# Patient Record
Sex: Male | Born: 1965 | Race: White | Hispanic: No | Marital: Married | Smoking: Never smoker
Health system: Southern US, Community
[De-identification: ages and names within clinical notes are randomized; demographics above are authoritative.]

## PROBLEM LIST (undated history)

## (undated) DIAGNOSIS — E663 Overweight: Secondary | ICD-10-CM

## (undated) DIAGNOSIS — K635 Polyp of colon: Secondary | ICD-10-CM

## (undated) DIAGNOSIS — Z789 Other specified health status: Secondary | ICD-10-CM

## (undated) DIAGNOSIS — M109 Gout, unspecified: Secondary | ICD-10-CM

## (undated) DIAGNOSIS — Z800 Family history of malignant neoplasm of digestive organs: Secondary | ICD-10-CM

## (undated) DIAGNOSIS — M1990 Unspecified osteoarthritis, unspecified site: Secondary | ICD-10-CM

## (undated) DIAGNOSIS — M722 Plantar fascial fibromatosis: Secondary | ICD-10-CM

## (undated) DIAGNOSIS — E785 Hyperlipidemia, unspecified: Secondary | ICD-10-CM

## (undated) DIAGNOSIS — J45909 Unspecified asthma, uncomplicated: Secondary | ICD-10-CM

## (undated) DIAGNOSIS — R17 Unspecified jaundice: Secondary | ICD-10-CM

## (undated) DIAGNOSIS — G8929 Other chronic pain: Secondary | ICD-10-CM

## (undated) HISTORY — DX: Other chronic pain: G89.29

## (undated) HISTORY — DX: Hyperlipidemia, unspecified: E78.5

## (undated) HISTORY — PX: APPENDECTOMY: SHX54

## (undated) HISTORY — DX: Gout, unspecified: M10.9

## (undated) HISTORY — DX: Plantar fascial fibromatosis: M72.2

## (undated) HISTORY — DX: Overweight: E66.3

## (undated) HISTORY — PX: VASECTOMY: SHX75

## (undated) HISTORY — PX: KNEE SURGERY: SHX244

## (undated) HISTORY — DX: Family history of malignant neoplasm of digestive organs: Z80.0

## (undated) HISTORY — PX: TONSILLECTOMY AND ADENOIDECTOMY: SUR1326

## (undated) HISTORY — DX: Unspecified jaundice: R17

---
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
## 2006-05-10 ENCOUNTER — Ambulatory Visit: Payer: Self-pay | Admitting: Internal Medicine

## 2009-12-10 ENCOUNTER — Ambulatory Visit: Payer: Self-pay

## 2009-12-19 ENCOUNTER — Ambulatory Visit: Payer: Self-pay

## 2010-02-07 ENCOUNTER — Ambulatory Visit: Payer: Self-pay | Admitting: General Practice

## 2010-02-14 ENCOUNTER — Ambulatory Visit: Payer: Self-pay

## 2012-03-31 IMAGING — US US EXTREM LOW VENOUS*R*
1 series · 17 of 23 positions shown · non-contrast
Comparison: none

REASON FOR EXAM: rt lower leg pain  swelling  eval DVT
COMMENTS:

[Series 1: us extrem low venous*right* · 17 of 23 slices shown]
[im 1/23]
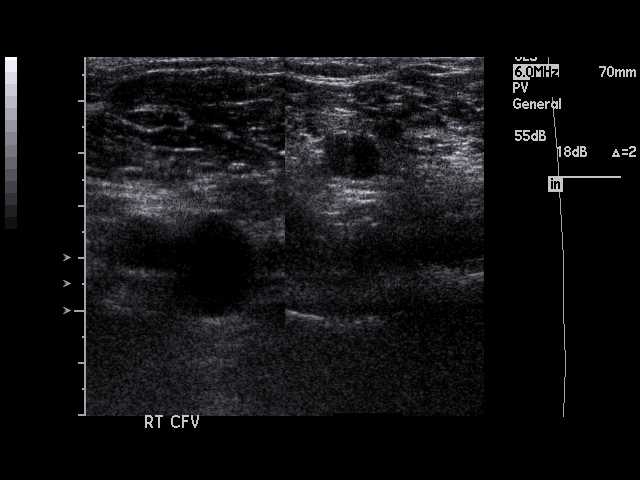
[im 3/23]
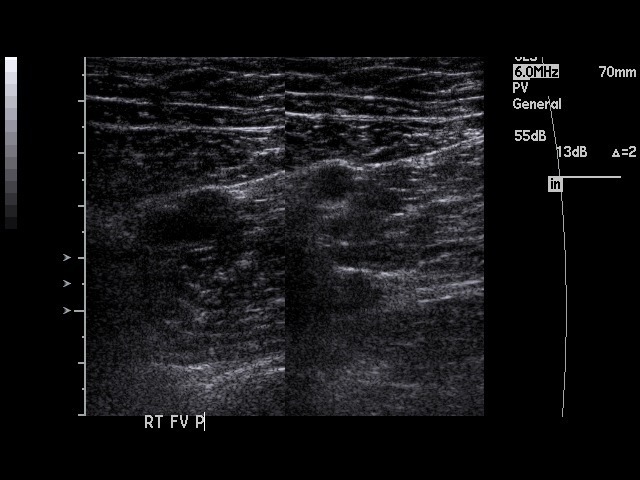
[im 4/23]
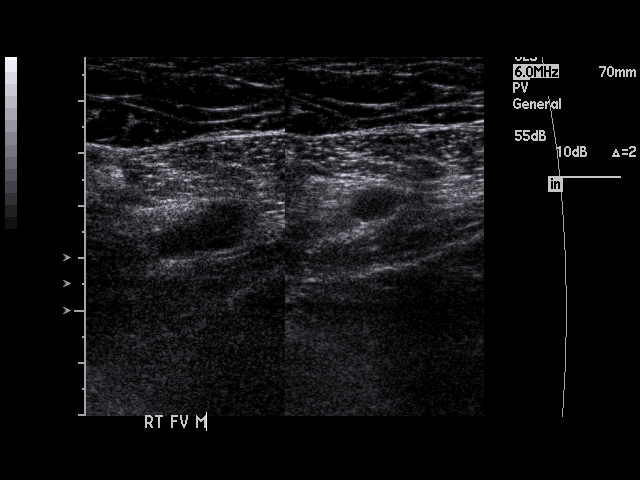
[im 5/23]
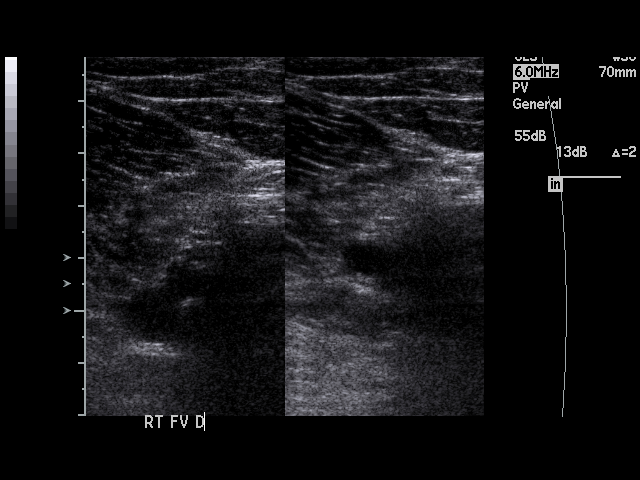
[im 7/23]
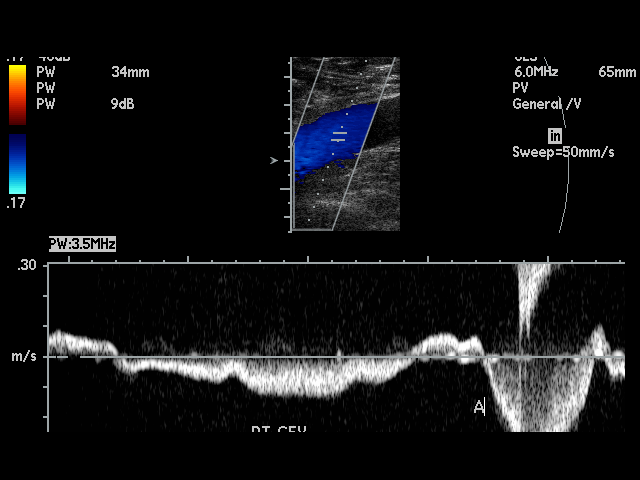
[im 8/23]
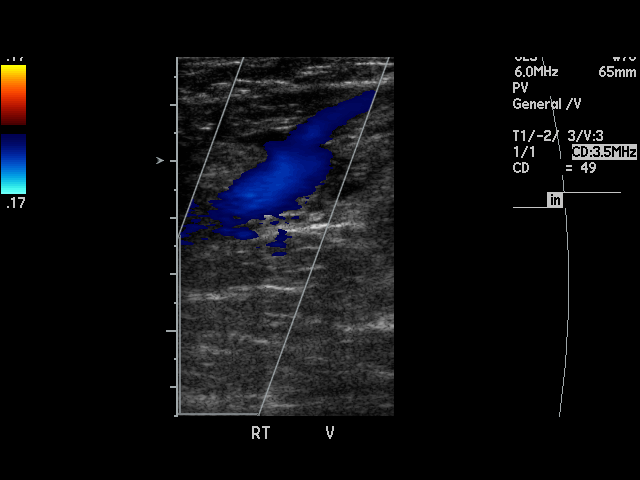
[im 9/23]
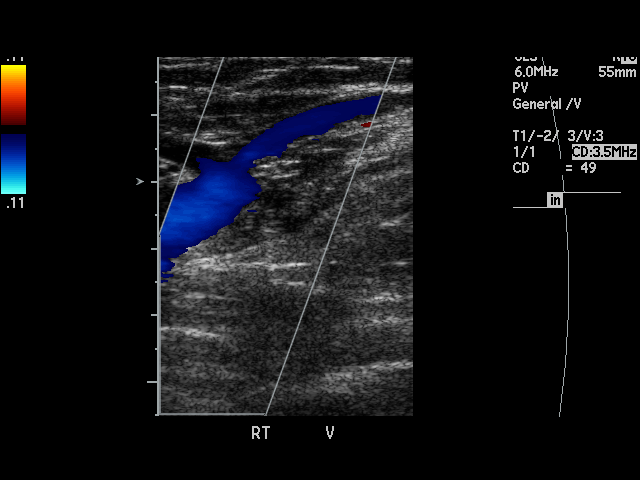
[im 11/23]
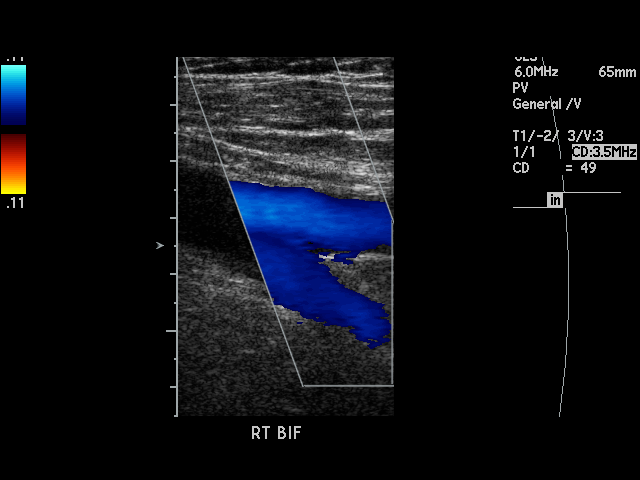
[im 12/23]
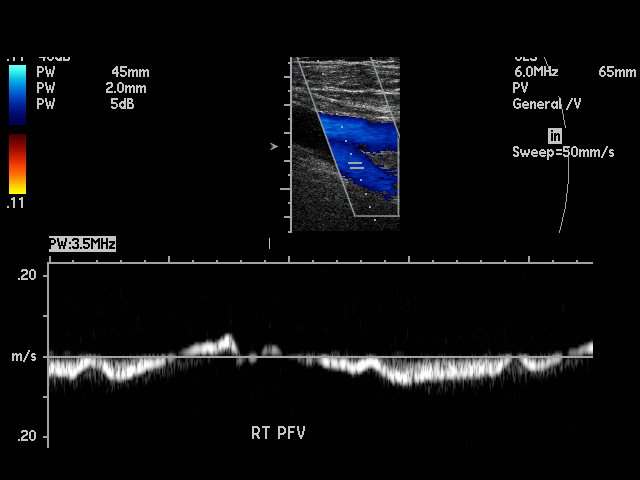
[im 13/23]
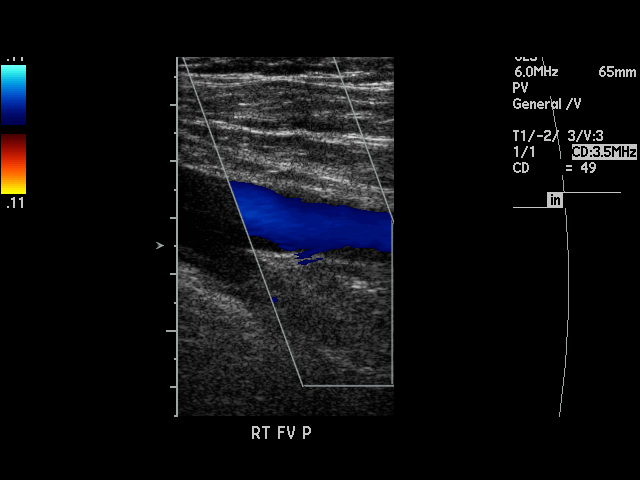
[im 15/23]
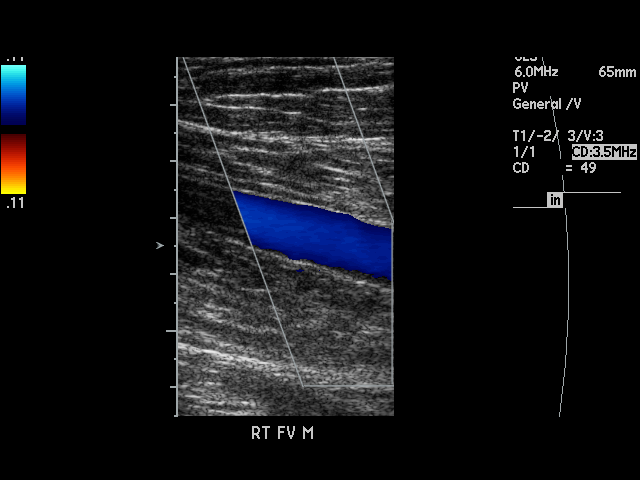
[im 16/23]
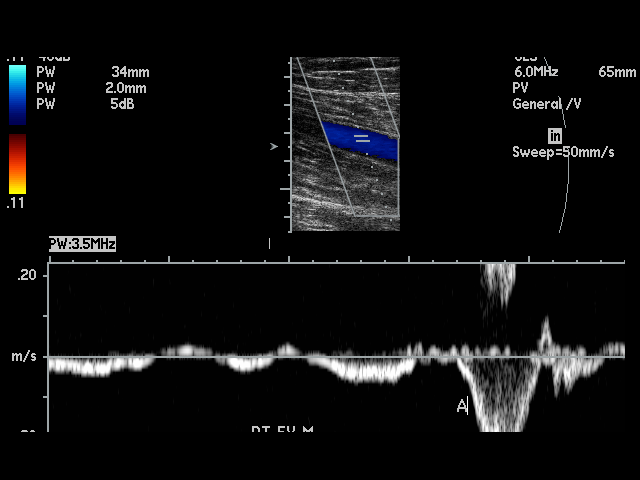
[im 17/23]
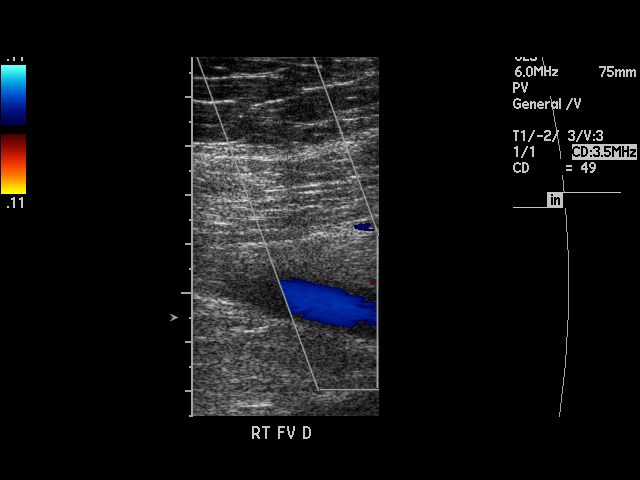
[im 19/23]
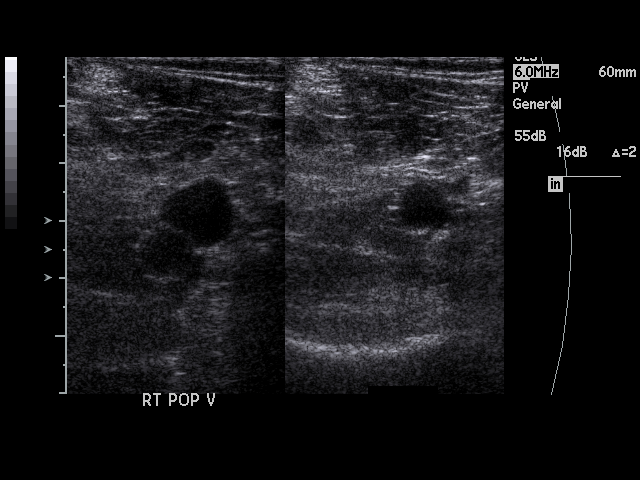
[im 20/23]
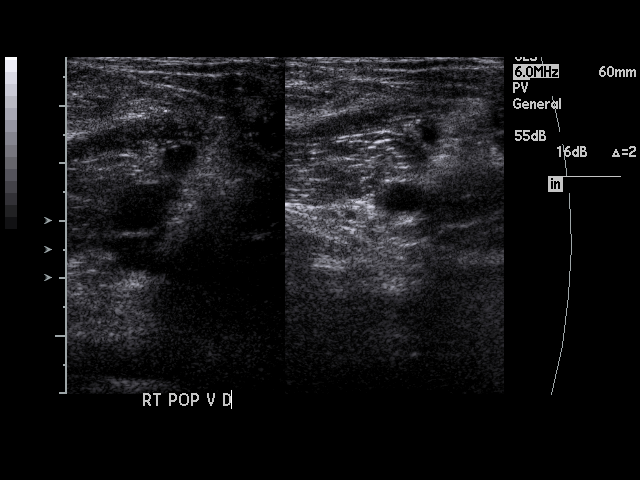
[im 21/23]
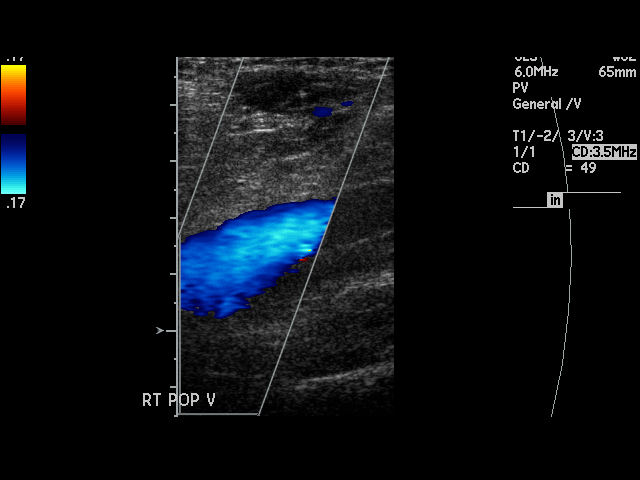
[im 23/23]
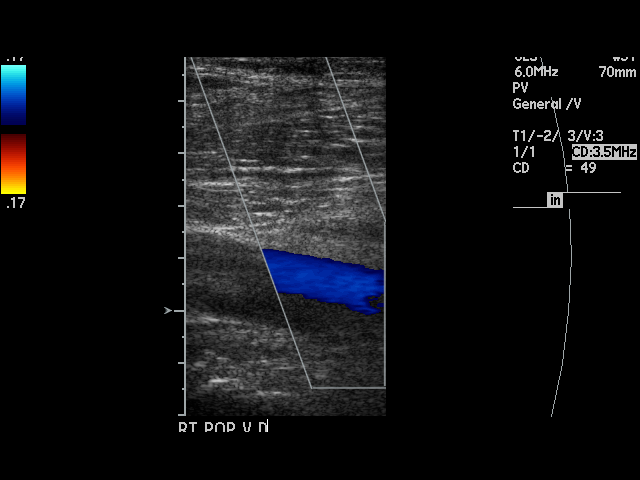

[17 of 23 positions shown; findings below may reference images not displayed]

PROCEDURE:     US  - US DOPPLER LOW EXTR RIGHT  - February 14, 2010  [DATE]

RESULT:     The augmentation and phasic flow waveforms are normal in
appearance. The femoral and popliteal vein shows complete compressibility
throughout its course. Doppler examination shows no occlusion or evidence of
deep vein thrombosis.
IMPRESSION: No deep vein thrombosis is identified in the right leg.

## 2014-06-16 DIAGNOSIS — K635 Polyp of colon: Secondary | ICD-10-CM

## 2014-06-16 HISTORY — DX: Polyp of colon: K63.5

## 2014-06-27 LAB — HM COLONOSCOPY

## 2014-07-16 ENCOUNTER — Ambulatory Visit: Payer: BC Managed Care – PPO | Admitting: Podiatry

## 2014-07-20 ENCOUNTER — Encounter: Payer: Self-pay | Admitting: Podiatry

## 2014-07-20 ENCOUNTER — Ambulatory Visit (INDEPENDENT_AMBULATORY_CARE_PROVIDER_SITE_OTHER): Payer: BC Managed Care – PPO | Admitting: Podiatry

## 2014-07-20 ENCOUNTER — Ambulatory Visit (INDEPENDENT_AMBULATORY_CARE_PROVIDER_SITE_OTHER): Payer: BC Managed Care – PPO

## 2014-07-20 ENCOUNTER — Other Ambulatory Visit: Payer: Self-pay | Admitting: *Deleted

## 2014-07-20 VITALS — BP 122/72 | HR 66 | Resp 16 | Ht 72.0 in | Wt 240.0 lb

## 2014-07-20 DIAGNOSIS — M109 Gout, unspecified: Secondary | ICD-10-CM | POA: Insufficient documentation

## 2014-07-20 DIAGNOSIS — E669 Obesity, unspecified: Secondary | ICD-10-CM

## 2014-07-20 DIAGNOSIS — M778 Other enthesopathies, not elsewhere classified: Secondary | ICD-10-CM

## 2014-07-20 DIAGNOSIS — M7750 Other enthesopathy of unspecified foot: Secondary | ICD-10-CM

## 2014-07-20 DIAGNOSIS — M779 Enthesopathy, unspecified: Principal | ICD-10-CM

## 2014-07-20 DIAGNOSIS — E6609 Other obesity due to excess calories: Secondary | ICD-10-CM | POA: Insufficient documentation

## 2014-07-20 MED ORDER — TRIAMCINOLONE ACETONIDE 10 MG/ML IJ SUSP
10.0000 mg | Freq: Once | INTRAMUSCULAR | Status: AC
Start: 2014-07-20 — End: 2014-07-31
  Administered 2014-07-31: 10 mg

## 2014-07-20 NOTE — Progress Notes (Signed)
   Subjective:    Patient ID: Bill Campbell, male    DOB: May 30, 1966, 48 y.o.   MRN: [id number]  HPI Comments: I had a gout flare up and it eased off and than the ball of foot feels like a stone bruise , its between the big toe and the second toe right foot. Burning feeling . It started Wednesday of last week. Fungus on the left great toenail.   Foot Pain      Review of Systems  All other systems reviewed and are negative.      Objective:   Physical Exam        Assessment & Plan:

## 2014-07-20 NOTE — Progress Notes (Signed)
Subjective:     Patient ID: Bill Campbell, male   DOB: [birth year], 48 y.o.   MRN: [id number] I'm having a lot of pain in my right foot second metatarsal for the last week and I had a gout attack a few weeks ago Foot Pain     Review of Systems  All other systems reviewed and are negative.      Objective:   Physical Exam  Nursing note and vitals reviewed. Constitutional: He is oriented to person, place, and time.  Cardiovascular: Intact distal pulses.   Musculoskeletal: Normal range of motion.  Neurological: He is oriented to person, place, and time.  Skin: Skin is warm.   neurovascular status intact with muscle strength adequate and range of motion of the subtalar and midtarsal joint within normal limits. Patient's found to have significant discomfort with fluid buildup in the second metatarsophalangeal joint right that is very painful when pressed and is noted to have good digital perfusion and is well oriented x3     Assessment:     Inflamed capsulitis second MPJ right with fluid buildup and area where there's been a structural bunion and history of gout right first MPJ    Plan:     H&P and x-rays reviewed. Did a proximal nerve block aspirated the right second MPJ and injected with half a cc of dexamethasone Kenalog and applied thick pad to reduce pressure on the joint teary at I did discuss that ultimately this may require surgery as it does appear there's been some damage to the flexor plate but we will make that determination as we gauged the response to medication he is on and we'll also consider long-term orthotic therapy

## 2014-07-20 NOTE — Patient Instructions (Signed)

## 2014-07-31 ENCOUNTER — Ambulatory Visit: Payer: BC Managed Care – PPO | Admitting: Podiatry

## 2014-07-31 VITALS — BP 123/70 | HR 63 | Resp 16

## 2014-07-31 DIAGNOSIS — M79609 Pain in unspecified limb: Secondary | ICD-10-CM

## 2014-07-31 DIAGNOSIS — M7750 Other enthesopathy of unspecified foot: Secondary | ICD-10-CM

## 2014-07-31 DIAGNOSIS — M779 Enthesopathy, unspecified: Principal | ICD-10-CM

## 2014-07-31 DIAGNOSIS — M778 Other enthesopathies, not elsewhere classified: Secondary | ICD-10-CM

## 2014-07-31 DIAGNOSIS — M109 Gout, unspecified: Secondary | ICD-10-CM

## 2014-07-31 MED ORDER — TRIAMCINOLONE ACETONIDE 10 MG/ML IJ SUSP
10.0000 mg | Freq: Once | INTRAMUSCULAR | Status: AC
  Administered 2014-07-31: 10 mg

## 2014-07-31 NOTE — Progress Notes (Signed)
Subjective:     Patient ID: Bill Campbell, male   DOB: October 05, 1966, 48 y.o.   MRN: [id number]  HPI patient states that it's better than it was but he did have several bouts of pain when he spent extensive amount of time on his foot   Review of Systems     Objective:   Physical Exam Neurovascular status intact with muscle strength adequate and patient noted to have continued discomfort second metatarsophalangeal joint right with fluid buildup around the joint surface    Assessment:     Continued inflammatory capsulitis right which has improved but is still present    Plan:     Dispensed graphite insole to reduce stress along with pads and scanned for custom orthotics to reduce stress against the joint surface. Reappoint when orthotics returned

## 2014-08-24 ENCOUNTER — Ambulatory Visit: Payer: BC Managed Care – PPO | Admitting: Podiatry

## 2014-08-31 ENCOUNTER — Encounter: Payer: Self-pay | Admitting: Podiatry

## 2014-08-31 ENCOUNTER — Ambulatory Visit (INDEPENDENT_AMBULATORY_CARE_PROVIDER_SITE_OTHER): Payer: BC Managed Care – PPO | Admitting: Podiatry

## 2014-08-31 VITALS — BP 126/77 | HR 64 | Resp 16

## 2014-08-31 DIAGNOSIS — M778 Other enthesopathies, not elsewhere classified: Secondary | ICD-10-CM

## 2014-08-31 DIAGNOSIS — M7751 Other enthesopathy of right foot: Secondary | ICD-10-CM

## 2014-08-31 DIAGNOSIS — M779 Enthesopathy, unspecified: Principal | ICD-10-CM

## 2014-08-31 NOTE — Patient Instructions (Signed)

## 2014-08-31 NOTE — Progress Notes (Signed)
Subjective:     Patient ID: Bill Campbell, male   DOB: 10-Jul-1966, 48 y.o.   MRN: [id number]  HPI patient presents stating his foot is feeling quite a bit better with occasional soreness if he does a lot of walking   Review of Systems     Objective:   Physical Exam Neurovascular status intact with inflammation around the second MPJ right which is improving over time and especially with graphite insole    Assessment:     Reviewed condition and discussed orthotic treatment    Plan:     Dispensed orthotics with instructions on usage and reappoint her recheck

## 2015-11-14 ENCOUNTER — Other Ambulatory Visit: Payer: Self-pay | Admitting: Family Medicine

## 2015-11-14 ENCOUNTER — Ambulatory Visit
Admission: RE | Admit: 2015-11-14 | Discharge: 2015-11-14 | Disposition: A | Discharge status: Home / Self Care | Payer: BLUE CROSS/BLUE SHIELD | Source: Ambulatory Visit | Attending: Family Medicine | Admitting: Family Medicine

## 2015-11-14 DIAGNOSIS — M5430 Sciatica, unspecified side: Secondary | ICD-10-CM | POA: Diagnosis not present

## 2015-11-14 DIAGNOSIS — M25559 Pain in unspecified hip: Secondary | ICD-10-CM | POA: Insufficient documentation

## 2015-11-14 DIAGNOSIS — M5137 Other intervertebral disc degeneration, lumbosacral region: Secondary | ICD-10-CM | POA: Diagnosis not present

## 2015-11-14 DIAGNOSIS — R52 Pain, unspecified: Secondary | ICD-10-CM

## 2017-01-19 LAB — PSA: PSA: 0.9

## 2017-01-19 LAB — LIPID PANEL
Cholesterol: 189 (ref 0–200)
HDL: 32 — AB (ref 35–70)
LDL Cholesterol: 125
Triglycerides: 159 (ref 40–160)

## 2017-12-29 IMAGING — CR DG HIP (WITH OR WITHOUT PELVIS) 2V BILAT
1 series · 5 of 5 positions shown · non-contrast
Comparison: None.

CLINICAL DATA: One year history of back and bilateral hip pain due
to a volleyball injury.

EXAM:
LUMBAR SPINE - COMPLETE 4+ VIEW; DG HIP (WITH OR WITHOUT PELVIS) 2V
BILAT

[Series 1: view not recorded · 0.14mm/px · 5 of 5 slices shown]
[im 1/5]
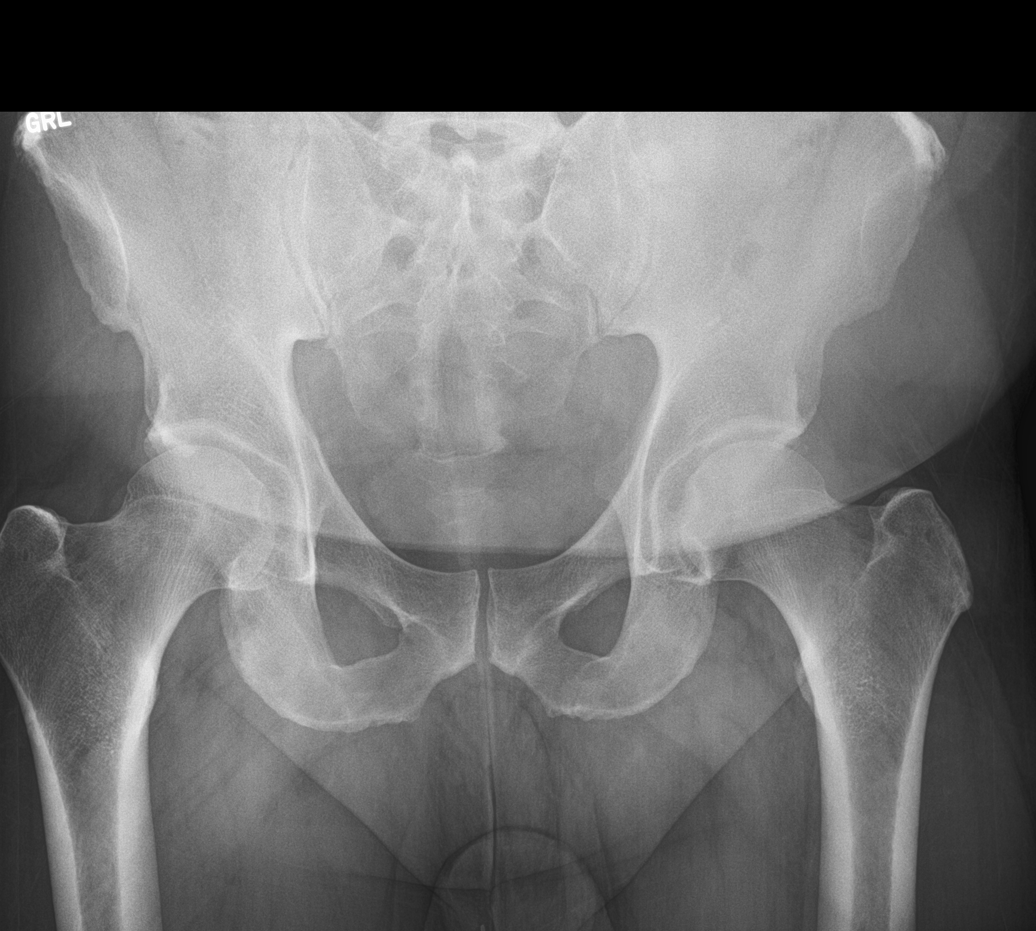
[im 2/5]
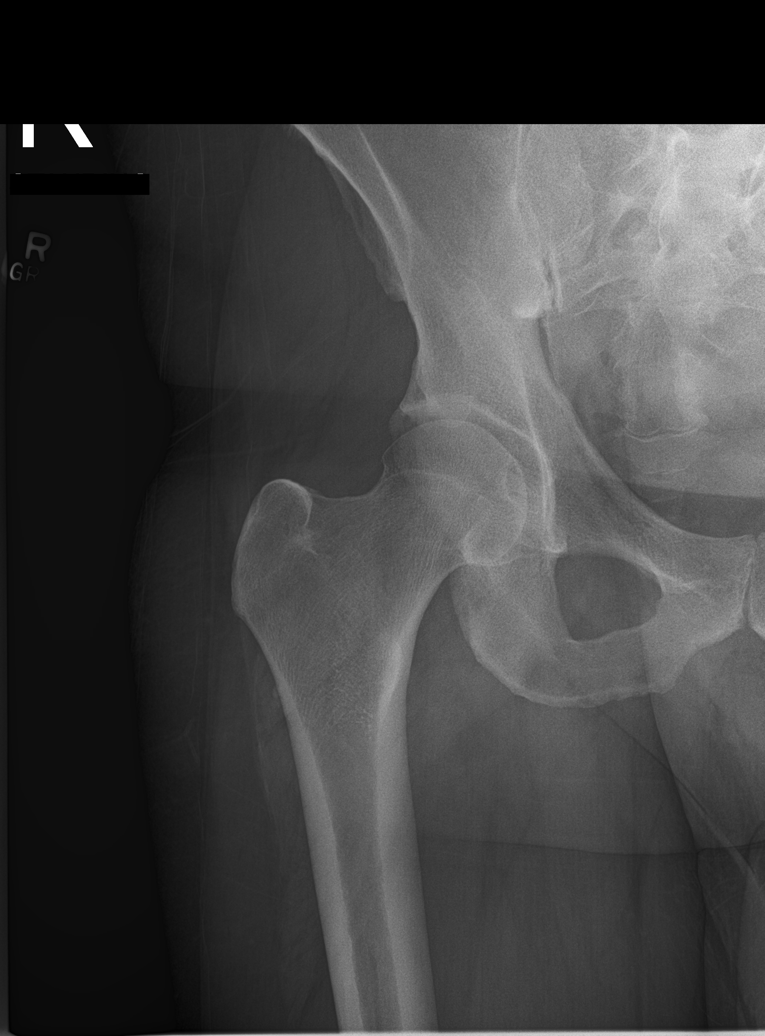
[im 3/5]
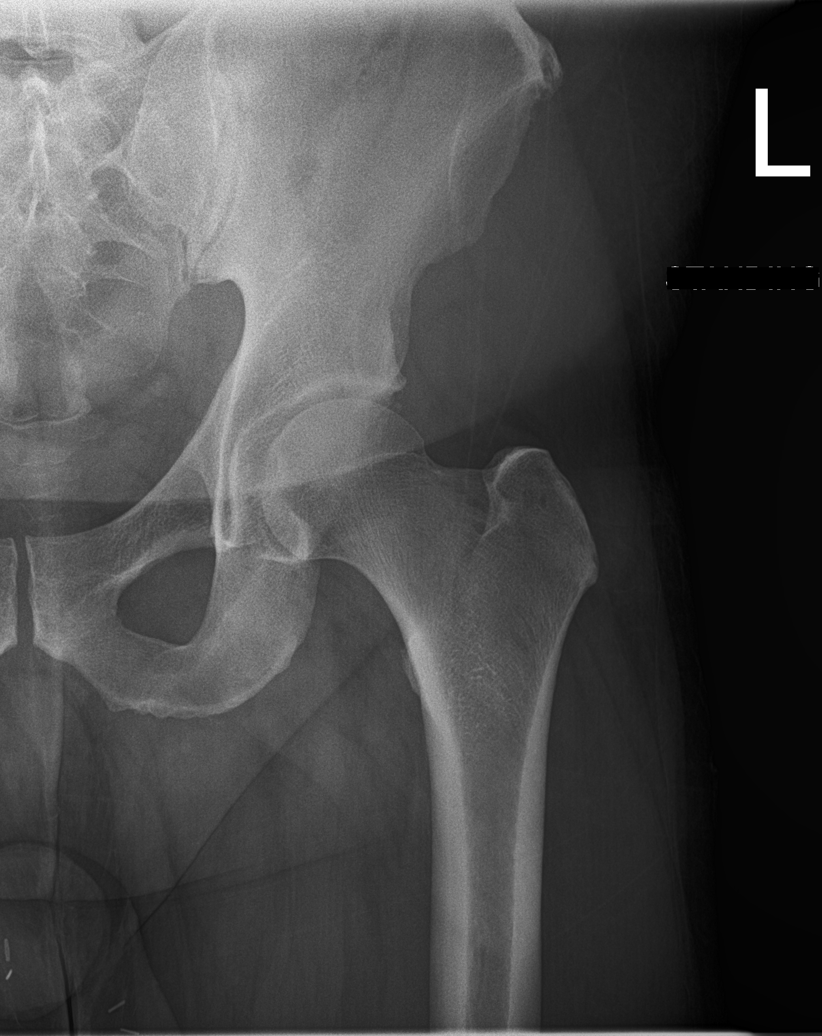
[im 4/5]
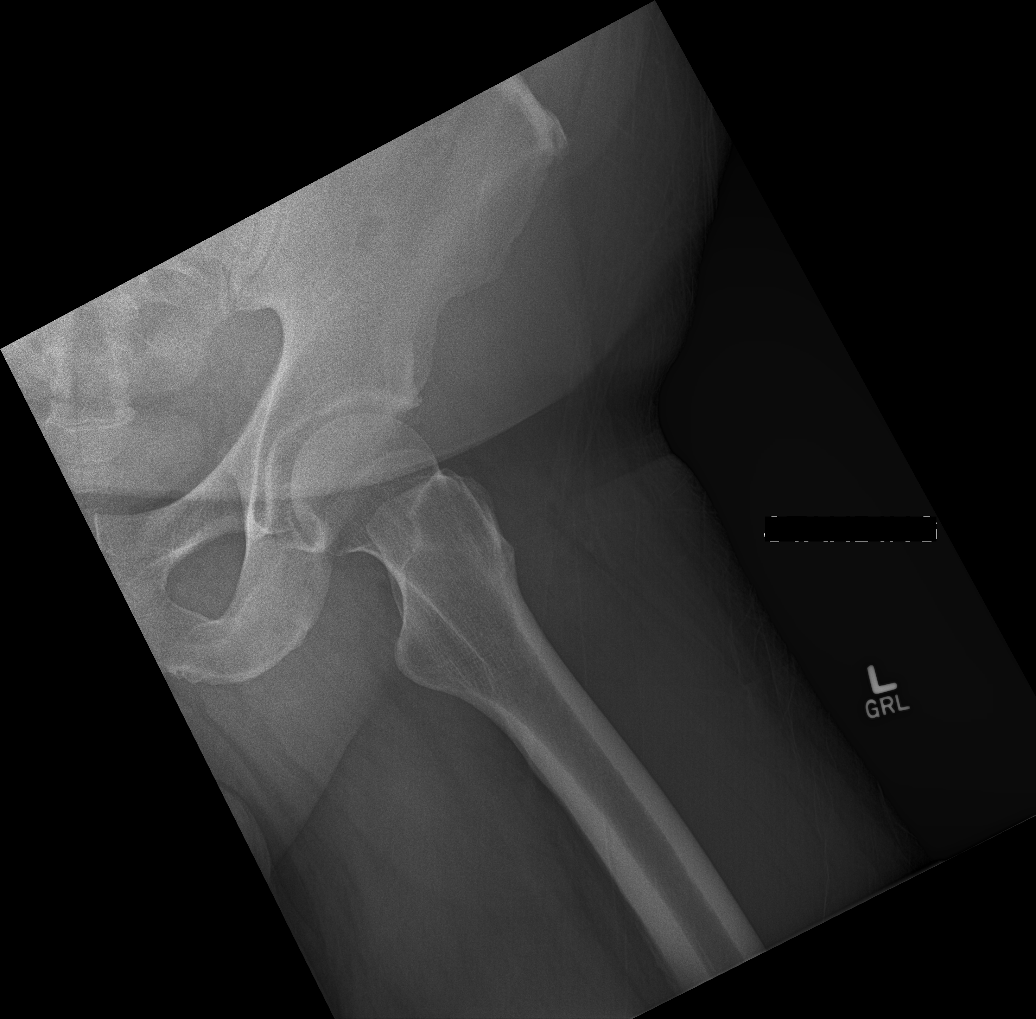
[im 5/5]
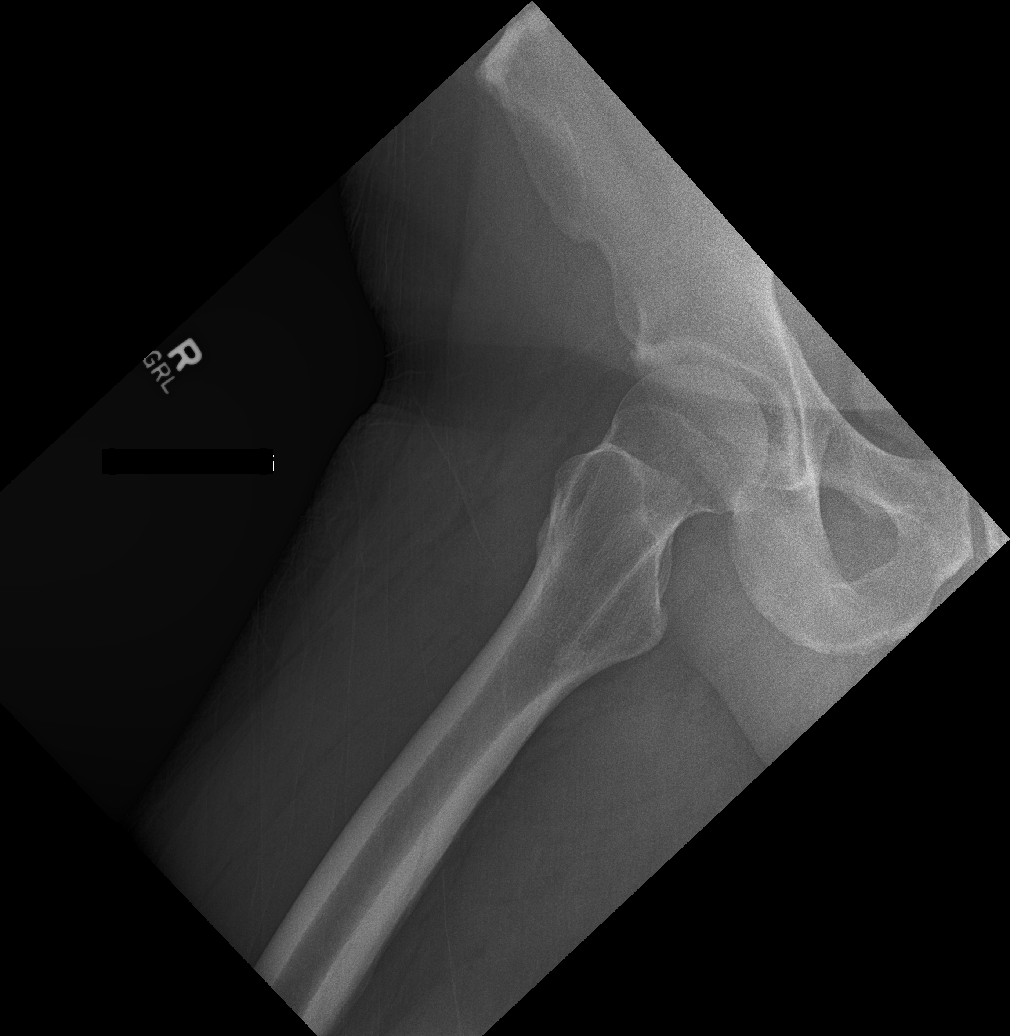

[5 of 5 positions shown; findings below may reference images not displayed]

FINDINGS: Lumbar spine:

Mild degenerative retrolisthesis of L5 is noted. No definite pars
defects. This is likely due to facet disease. Mild degenerative disc
disease at L4-5 and L5-S1. No acute bony findings. The visualized
bony pelvis is intact. The SI joints appear normal.

Bilateral hips:

Both hips are normally located. No significant degenerative changes
or acute bony findings. No plain film evidence of avascular
necrosis. The pubic symphysis and SI joints are intact. No bony
pelvic abnormalities.
IMPRESSION: Degenerative retrolisthesis of L5 due to facet disease.

Mild degenerative disc disease at L4-5 and L5-S1.

Both hips are normally located and do not demonstrate any
significant degenerative changes or acute findings.

## 2018-01-26 LAB — LIPID PANEL
Cholesterol: 160 (ref 0–200)
HDL: 30 — AB (ref 35–70)
LDL Cholesterol: 98
Triglycerides: 161 — AB (ref 40–160)

## 2019-05-03 ENCOUNTER — Ambulatory Visit: Payer: Self-pay | Admitting: Internal Medicine

## 2019-05-03 ENCOUNTER — Encounter: Payer: Self-pay | Admitting: Internal Medicine

## 2019-05-03 ENCOUNTER — Other Ambulatory Visit: Payer: Self-pay

## 2019-05-03 VITALS — BP 142/89 | HR 57 | Temp 98.4°F | Resp 16 | Ht 72.0 in | Wt 267.0 lb

## 2019-05-03 DIAGNOSIS — M109 Gout, unspecified: Secondary | ICD-10-CM

## 2019-05-03 MED ORDER — INDOMETHACIN 25 MG PO CAPS: 25.0000 mg | ORAL_CAPSULE | Freq: Three times a day (TID) | ORAL | 2 refills | Status: DC | PRN

## 2019-05-03 NOTE — Progress Notes (Signed)
S - presents with concerns for gout Notes pain in left foot and ankle, not at 1st MTP (never had there in past), starting to feel some pain and mild discomfort in right ankle last couple days, not as bad as left   No fevers Has tried indomethacin 25mg  bid to help with sx's to date. Has one pill left. No stomach issues with this and is still working, on his feet often with work and doing ok to date + h/o gout in past, uric acid levels were high and allopurinol started, was then off if it for awhile, and restarted about 6 months ago and taking regularly No major recent dietary indiscretion noted Alcohol use - + beer (about 6/week noted) Non-smoker, + smokeless tob  Allergies  Allergen Reactions  . Penicillins Other (See Comments)    Reaction as a child     Current Outpatient Medications on File Prior to Visit  Medication Sig Dispense Refill  . allopurinol (ZYLOPRIM) 100 MG tablet Take 1 tablet by mouth daily.    . indomethacin (INDOCIN) 25 MG capsule Take 1 capsule by mouth as needed.     No current facility-administered medications on file prior to visit.      O - NAD, looks well, masked  BP (!) 142/89 (BP Location: Right Arm, Patient Position: Sitting, Cuff Size: Large)   Pulse (!) 57   Temp 98.4 F (36.9 C)   Resp 16   Ht 6' (1.829 m)   Wt 267 lb (121.1 kg)   SpO2 97%   BMI 36.21 kg/m    HEENT - sclera anicteric Ext - mild increased erythema and swelling noted surrounding the lateral malleolus on the left ankle and extends down on the dorsal aspect of the foot laterally towards the 5th MT, tender to palpation in this region, no swelling or tenderness over 1st MTP joint or other MTP joints.  Mild erythema and swelling right ankle surrounding the lateral malleolus, minimally tender to palpate this area, less foot involvement on the right and no MTP joints involved on the right  ROM limited of ankle only at the extreme of dorsiflexion on left > right. Good toe motions Sensation  intact to LT over foot and DP pulse intact Affect not flat, approp with conversation  Last Uric acid level - 8.9 (01/2018)  A/P - Gout flare concern likely source, did review pseudogout possibility, doubt infectious concern presently.   Educated    Not as helpful to check uric acid level acutely, best when sx's improved in the near future, and due for yearly lab check presently and will do in 3-4 weeks with annual PE after  Indomethacin - agreed to write for 25 mg, 1-2 up to tid prn, take with food and as improving, stop and can use generic OTC naproxen (220mg ) - one to two tabs twice daily prn with food (encouraged to transition as soon as possible to lessen concerns taking the indomethacin, mainly the stomach and can affect the kidneys as well) Stay off feet and elevate when home rec'ed BP borderline high today, with above sx's probably contributing. No h/o HTN and will have f/u check with his annual PE soon  F/u sooner if not improving or worsening, and may need to lessen on feet activities with work if is worsening some noted today

## 2019-05-10 ENCOUNTER — Other Ambulatory Visit: Payer: Self-pay

## 2019-05-10 ENCOUNTER — Ambulatory Visit: Payer: Self-pay | Admitting: Internal Medicine

## 2019-05-10 ENCOUNTER — Encounter: Payer: Self-pay | Admitting: Internal Medicine

## 2019-05-10 VITALS — BP 149/87 | HR 74 | Temp 98.4°F | Resp 16 | Ht 72.0 in | Wt 266.0 lb

## 2019-05-10 DIAGNOSIS — M25572 Pain in left ankle and joints of left foot: Secondary | ICD-10-CM

## 2019-05-10 NOTE — Progress Notes (Signed)
S - presents after saw last Wed with concerns for possible gout Noted pain in left foot and ankle, not at 1st MTP (never had there in past), started to feel some pain and mild discomfort in right ankle as well, not as bad as left. Took indomethacin - 50mg  bid and has helped with the left ankle pain resolved, the right ankle also pretty much resolved, and now noted in the last day or two, more redness on the lateral foot and painful with pressure and walking at times. Mild swelling  No stomach issues with the Indomethacin  + h/o gout in past, uric acid levels were high and allopurinol started, was then off if it for awhile, and restarted about 6 months ago and taking regularly as well Is scheduled to go back to beach tomorrow thru the weekend. No fevers, not feel ill,  No trauma prior No opening to the skin like a bug bite  Alcohol use - + beer (about 6/week noted) Non-smoker, + smokeless tob  Allergies  Allergen Reactions  . Penicillins Other (See Comments)    Reaction as a child    Current Outpatient Medications on File Prior to Visit  Medication Sig Dispense Refill  . allopurinol (ZYLOPRIM) 100 MG tablet Take 1 tablet by mouth daily.    . indomethacin (INDOCIN) 25 MG capsule Take 1 capsule (25 mg total) by mouth 3 (three) times daily as needed (Take 1-2 caps up to tid prn, take with food). 40 capsule 2   No current facility-administered medications on file prior to visit.       O - NAD, looks well, masked  BP (!) 149/87 (BP Location: Right Arm, Patient Position: Sitting, Cuff Size: Large)   Pulse 74   Temp 98.4 F (36.9 C) (Oral)   Resp 16   Ht 6' (1.829 m)   Wt 266 lb (120.7 kg)   SpO2 97%   BMI 36.08 kg/m    HEENT - sclera anicteric Ext - mild increased erythema and swelling noted over proximal 5th MT on dorsal aspect, less than tennis ball sized area of erythema, was tender to palpate, min soft tissue swelling, NT to palpate the 5th MTP joint and more tender over  the prox 5th MT than proximal at the joint of the prox 5th MT. No erythema or tenderness surrounding the lateral malleolus on the left ankle and good ROM of the ankle without pain. No swelling or tenderness over 1st MTP joint or other MTP joints. No skin opening.No induration. Good toe motions without pain Sensation intact to LT over foot and DP pulse intact Affect not flat, approp with conversation  Last Uric acid level - 8.9 (01/2018)  A/P - Mild soft tissue inflammation, 5th MT region of left foot. Timing may suggest possible gout flare concern, but less over joint area noted, no marked cellulitis concern presently, doubt a bony injury concern of the 5th MT and no trauma noted prior             Educated on above Indomethacin - previously wrote for 25 mg, 1-2 up to bid prn with food short term and if improving over next couple days rapidly wean , and can use generic OTC naproxen (220mg ) - one to two tabs twice daily prn with food or ibuprofen OTC prn with food prn after (encouraged to transition as soon as possible to lessen concerns taking the indomethacin, mainly the stomach and can affect the kidneys as well) Stay off feet as best can  short term and elevate when home rec'ed and cold topically can help BP still borderline today, with above sx's probably contributing. No h/o HTN and will have f/u check with his annual PE soon             F/u sooner if not improving or worsening, and warned if redness spreading, more concerns with infection and if more painful and swollen, will need to lessen on feet activities and follow-up to be seen, may need an x-ray, other meds pending re-assessment and he was understanding of that today.

## 2019-05-26 ENCOUNTER — Ambulatory Visit: Payer: Self-pay

## 2019-05-26 ENCOUNTER — Other Ambulatory Visit: Payer: Self-pay

## 2019-05-26 DIAGNOSIS — Z0189 Encounter for other specified special examinations: Secondary | ICD-10-CM

## 2019-05-26 LAB — POCT URINALYSIS DIPSTICK
Bilirubin, UA: NEGATIVE
Blood, UA: NEGATIVE
Glucose, UA: NEGATIVE
Leukocytes, UA: NEGATIVE
Nitrite, UA: NEGATIVE
Protein, UA: NEGATIVE
Spec Grav, UA: 1.015 (ref 1.010–1.025)
Urobilinogen, UA: 0.2 E.U./dL
pH, UA: 8 (ref 5.0–8.0)

## 2019-05-26 LAB — URIC ACID
Uric Acid: 8.9
Uric Acid: 9.9

## 2019-05-27 LAB — CMP12+LP+TP+TSH+6AC+PSA+CBC…
ALT: 27 IU/L (ref 0–44)
AST: 16 IU/L (ref 0–40)
Albumin/Globulin Ratio: 2.3 — ABNORMAL HIGH (ref 1.2–2.2)
Albumin: 4.3 g/dL (ref 3.8–4.9)
Alkaline Phosphatase: 57 IU/L (ref 39–117)
BUN/Creatinine Ratio: 18 (ref 9–20)
BUN: 18 mg/dL (ref 6–24)
Basophils Absolute: 0.1 10*3/uL (ref 0.0–0.2)
Basos: 1 %
Bilirubin Total: 1.1 mg/dL (ref 0.0–1.2)
Calcium: 9.2 mg/dL (ref 8.7–10.2)
Chloride: 100 mmol/L (ref 96–106)
Chol/HDL Ratio: 5.4 ratio — ABNORMAL HIGH (ref 0.0–5.0)
Cholesterol, Total: 162 mg/dL (ref 100–199)
Creatinine, Ser: 1.02 mg/dL (ref 0.76–1.27)
EOS (ABSOLUTE): 0.1 10*3/uL (ref 0.0–0.4)
Eos: 2 %
Estimated CHD Risk: 1.1 times avg. — ABNORMAL HIGH (ref 0.0–1.0)
Free Thyroxine Index: 1.8 (ref 1.2–4.9)
GFR calc Af Amer: 97 mL/min/{1.73_m2} (ref >59)
GFR calc non Af Amer: 84 mL/min/{1.73_m2} (ref >59)
GGT: 19 IU/L (ref 0–65)
Globulin, Total: 1.9 g/dL (ref 1.5–4.5)
Glucose: 104 mg/dL — ABNORMAL HIGH (ref 65–99)
HDL: 30 mg/dL — ABNORMAL LOW (ref >39)
Hematocrit: 44.9 % (ref 37.5–51.0)
Hemoglobin: 15.9 g/dL (ref 13.0–17.7)
Immature Grans (Abs): 0.1 10*3/uL (ref 0.0–0.1)
Immature Granulocytes: 1 %
Iron: 92 ug/dL (ref 38–169)
LDH: 135 IU/L (ref 121–224)
LDL Calculated: 98 mg/dL (ref 0–99)
Lymphocytes Absolute: 1.6 10*3/uL (ref 0.7–3.1)
Lymphs: 31 %
MCH: 30.5 pg (ref 26.6–33.0)
MCHC: 35.4 g/dL (ref 31.5–35.7)
MCV: 86 fL (ref 79–97)
Monocytes Absolute: 0.3 10*3/uL (ref 0.1–0.9)
Monocytes: 6 %
Neutrophils Absolute: 2.9 10*3/uL (ref 1.4–7.0)
Neutrophils: 59 %
Phosphorus: 3 mg/dL (ref 2.8–4.1)
Platelets: 222 10*3/uL (ref 150–450)
Potassium: 4.5 mmol/L (ref 3.5–5.2)
RBC: 5.21 x10E6/uL (ref 4.14–5.80)
RDW: 12.3 % (ref 11.6–15.4)
Sodium: 136 mmol/L (ref 134–144)
T3 Uptake Ratio: 28 % (ref 24–39)
T4, Total: 6.6 ug/dL (ref 4.5–12.0)
TSH: 1.72 u[IU]/mL (ref 0.450–4.500)
Total Protein: 6.2 g/dL (ref 6.0–8.5)
Triglycerides: 168 mg/dL — ABNORMAL HIGH (ref 0–149)
Uric Acid: 5.3 mg/dL (ref 3.7–8.6)
VLDL Cholesterol Cal: 34 mg/dL (ref 5–40)
WBC: 5 10*3/uL (ref 3.4–10.8)

## 2019-05-27 LAB — CMP12+LP+TP+TSH+6AC+PSA+CBC?: Prostate Specific Ag, Serum: 1.5 ng/mL (ref 0.0–4.0)

## 2019-05-29 ENCOUNTER — Encounter: Payer: Self-pay | Admitting: Internal Medicine

## 2019-05-29 ENCOUNTER — Ambulatory Visit: Payer: 59 | Admitting: Internal Medicine

## 2019-05-29 ENCOUNTER — Other Ambulatory Visit: Payer: Self-pay

## 2019-05-29 VITALS — BP 131/88 | HR 64 | Temp 98.2°F | Resp 14 | Ht 72.0 in | Wt 264.0 lb

## 2019-05-29 DIAGNOSIS — E6609 Other obesity due to excess calories: Secondary | ICD-10-CM

## 2019-05-29 DIAGNOSIS — Z8739 Personal history of other diseases of the musculoskeletal system and connective tissue: Secondary | ICD-10-CM | POA: Insufficient documentation

## 2019-05-29 DIAGNOSIS — M545 Low back pain, unspecified: Secondary | ICD-10-CM

## 2019-05-29 DIAGNOSIS — R748 Abnormal levels of other serum enzymes: Secondary | ICD-10-CM | POA: Insufficient documentation

## 2019-05-29 DIAGNOSIS — G8929 Other chronic pain: Secondary | ICD-10-CM | POA: Insufficient documentation

## 2019-05-29 NOTE — Progress Notes (Signed)
S  - presents for annual physical evaluation  Has a chronic back condition and saw neurosurg and spine in the past and managing to date. Now on aleve, one a day and off mobic and indocin with any regularity.  Limited in his activities with his back when asked about exercise.  Has a gout history and on allopurinol and no very recent flares  Weight has been about the same in past year.  No specific complaints, denies any recent CP, palpitations, SOB, abdominal pains, change in bowel habits, dark/black stools, vision changes, recent fevers or covid sx's of concern, up rarely once a night to urinate, no urgency or frequency,   Exercise - no regular exercise as above noted  Meds reviewed Current Outpatient Medications on File Prior to Visit  Medication Sig Dispense Refill  . allopurinol (ZYLOPRIM) 100 MG tablet Take 1 tablet by mouth daily.    . indomethacin (INDOCIN) 25 MG capsule Take 1 capsule (25 mg total) by mouth 3 (three) times daily as needed (Take 1-2 caps up to tid prn, take with food). 40 capsule 2   No current facility-administered medications on file prior to visit.     Allergies  Allergen Reactions  . Penicillins Other (See Comments)    Reaction as a child    Social History   Tobacco Use  Smoking Status Never Smoker  Smokeless Tobacco Current User  . Types: Chew     FH - GF with colon CA, dx'ed in his 54's, Dad died from MI age 52, M with HTN  O - NAD, masked  BP 131/88 (BP Location: Left Arm, Patient Position: Sitting, Cuff Size: Large)   Pulse 64   Temp 98.2 F (36.8 C) (Oral)   Resp 14   Ht 6' (1.829 m)   Wt 264 lb (119.7 kg)   SpO2 99%   BMI 35.80 kg/m    HEENT - sclera anicteric, PERRL, EOMI, conj - non-inj'ed, nares patent, TM's and canals clear Neck - supple, no adenopathy, no TM, caortids 2+ and = bilat, no bruits Car - RRR without m/g/r Pulm- CTA without wheeze or rales Abd - soft, obese, NT, ND, BS+, no obvious HSM, no masses Back - no CVA  tenderness Skin- no new lesions of concern on exposed areas, denied otherwise Ext - no LE edema, no active joints GU - no swelling in inguinal/suprapubic region, NT,  testicle and prostate exams deferred (without concerning sx's and after discussion on current recommendations for prostate CA screening including PSA tests) Neuro - affect was not flat, appropriate with conversation  Grossly non-focal with good strength on testing, sensation intact to LT in distal extremities, DTR's 2+ and = patella, Romberg neg, no pronator drift, good balance on one foot, good finger to nose   Labs reviewed - low HDL, uric acid level good at 5.3, PSA 1.5 Hearing screen reviewed and ok ECG reviewed - no concerning changes from prior ECG Colonoscopy screening discussed and reviewed, last one 2015, noted f/u 2025 on report after that one done, with more recent annual PE forms noting due in 2020. He was not sure when rec'ed for f/u.  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 A/P - Gout history   Uric acid level improved  Controlled with allopurinol and continue  Diet rec's importance noted  2. Chronic Back pain - regular aerobic exercise and flexibility exercises important to help in management and encouraged, noted exercise  in water as an option. Also chronic NSAIDs can increase BP and affect kidneys and helpful has lessened these in the recent past noted.    3. Increased BMI/obesity - above to help with this and keeping weight down can help with back pain also.   Importance of diet modifications and regular aerobic exercise emphasized  4. Low HDL, mild increase TG's - noted increasing activity best way to help increase HDL  5. Discussed FH of colon CA - not change screening with age of dx in GF, report from prior notes f/u in 2025, although not clear with his history   Agreed best approach was to have him call the [place] office of his doctor (Dr. Carlota Raspberry) and  inquire if they rec a colonoscopy sooner than 2025 (think unlikely), and if they do, proceed with that recommendation.

## 2019-11-13 DIAGNOSIS — U071 COVID-19: Secondary | ICD-10-CM | POA: Diagnosis not present

## 2019-11-17 DIAGNOSIS — Z09 Encounter for follow-up examination after completed treatment for conditions other than malignant neoplasm: Secondary | ICD-10-CM | POA: Diagnosis not present

## 2019-11-17 DIAGNOSIS — Z8616 Personal history of COVID-19: Secondary | ICD-10-CM | POA: Diagnosis not present

## 2019-11-17 DIAGNOSIS — R030 Elevated blood-pressure reading, without diagnosis of hypertension: Secondary | ICD-10-CM | POA: Diagnosis not present

## 2020-04-16 ENCOUNTER — Other Ambulatory Visit: Payer: Self-pay

## 2020-04-16 DIAGNOSIS — Z8739 Personal history of other diseases of the musculoskeletal system and connective tissue: Secondary | ICD-10-CM

## 2020-04-16 MED ORDER — ALLOPURINOL 100 MG PO TABS: 100.0000 mg | ORAL_TABLET | Freq: Every day | ORAL | 0 refills | Status: DC

## 2020-04-18 ENCOUNTER — Other Ambulatory Visit: Payer: Self-pay

## 2020-04-18 DIAGNOSIS — Z8739 Personal history of other diseases of the musculoskeletal system and connective tissue: Secondary | ICD-10-CM

## 2020-04-18 MED ORDER — ALLOPURINOL 300 MG PO TABS: 300.0000 mg | ORAL_TABLET | Freq: Every day | ORAL | 1 refills | Status: DC

## 2020-05-24 NOTE — Progress Notes (Signed)
Scheduled to complete physical

## 2020-05-28 ENCOUNTER — Ambulatory Visit: Payer: Self-pay

## 2020-05-28 ENCOUNTER — Other Ambulatory Visit: Payer: Self-pay

## 2020-05-28 DIAGNOSIS — Z0000 Encounter for general adult medical examination without abnormal findings: Secondary | ICD-10-CM

## 2020-05-28 LAB — POCT URINALYSIS DIPSTICK
Bilirubin, UA: NEGATIVE
Blood, UA: NEGATIVE
Glucose, UA: NEGATIVE
Ketones, UA: NEGATIVE
Leukocytes, UA: NEGATIVE
Nitrite, UA: NEGATIVE
Protein, UA: POSITIVE — AB
Spec Grav, UA: 1.025 (ref 1.010–1.025)
Urobilinogen, UA: 0.2 E.U./dL
pH, UA: 6 (ref 5.0–8.0)

## 2020-05-29 LAB — CMP12+LP+TP+TSH+6AC+PSA+CBC…
ALT: 30 IU/L (ref 0–44)
AST: 19 IU/L (ref 0–40)
Albumin/Globulin Ratio: 2 (ref 1.2–2.2)
Albumin: 4.2 g/dL (ref 3.8–4.9)
Alkaline Phosphatase: 61 IU/L (ref 48–121)
BUN/Creatinine Ratio: 17 (ref 9–20)
BUN: 18 mg/dL (ref 6–24)
Basophils Absolute: 0.1 10*3/uL (ref 0.0–0.2)
Basos: 1 %
Bilirubin Total: 1.1 mg/dL (ref 0.0–1.2)
Calcium: 9.3 mg/dL (ref 8.7–10.2)
Chloride: 101 mmol/L (ref 96–106)
Chol/HDL Ratio: 6.5 ratio — ABNORMAL HIGH (ref 0.0–5.0)
Cholesterol, Total: 188 mg/dL (ref 100–199)
Creatinine, Ser: 1.05 mg/dL (ref 0.76–1.27)
EOS (ABSOLUTE): 0.1 10*3/uL (ref 0.0–0.4)
Eos: 2 %
Estimated CHD Risk: 1.4 times avg. — ABNORMAL HIGH (ref 0.0–1.0)
Free Thyroxine Index: 1.7 (ref 1.2–4.9)
GFR calc Af Amer: 93 mL/min/{1.73_m2} (ref >59)
GFR calc non Af Amer: 81 mL/min/{1.73_m2} (ref >59)
GGT: 35 IU/L (ref 0–65)
Globulin, Total: 2.1 g/dL (ref 1.5–4.5)
Glucose: 97 mg/dL (ref 65–99)
HDL: 29 mg/dL — ABNORMAL LOW (ref >39)
Hematocrit: 46.2 % (ref 37.5–51.0)
Hemoglobin: 15.9 g/dL (ref 13.0–17.7)
Immature Grans (Abs): 0.1 10*3/uL (ref 0.0–0.1)
Immature Granulocytes: 1 %
Iron: 140 ug/dL (ref 38–169)
LDH: 159 IU/L (ref 121–224)
LDL Chol Calc (NIH): 111 mg/dL — ABNORMAL HIGH (ref 0–99)
Lymphocytes Absolute: 1.7 10*3/uL (ref 0.7–3.1)
Lymphs: 28 %
MCH: 30.5 pg (ref 26.6–33.0)
MCHC: 34.4 g/dL (ref 31.5–35.7)
MCV: 89 fL (ref 79–97)
Monocytes Absolute: 0.4 10*3/uL (ref 0.1–0.9)
Monocytes: 6 %
Neutrophils Absolute: 3.8 10*3/uL (ref 1.4–7.0)
Neutrophils: 62 %
Phosphorus: 3 mg/dL (ref 2.8–4.1)
Platelets: 248 10*3/uL (ref 150–450)
Potassium: 5.1 mmol/L (ref 3.5–5.2)
Prostate Specific Ag, Serum: 2 ng/mL (ref 0.0–4.0)
RBC: 5.21 x10E6/uL (ref 4.14–5.80)
RDW: 12.3 % (ref 11.6–15.4)
Sodium: 139 mmol/L (ref 134–144)
T3 Uptake Ratio: 29 % (ref 24–39)
T4, Total: 6 ug/dL (ref 4.5–12.0)
TSH: 1.68 u[IU]/mL (ref 0.450–4.500)
Total Protein: 6.3 g/dL (ref 6.0–8.5)
Triglycerides: 277 mg/dL — ABNORMAL HIGH (ref 0–149)
Uric Acid: 6.8 mg/dL (ref 3.8–8.4)
VLDL Cholesterol Cal: 48 mg/dL — ABNORMAL HIGH (ref 5–40)
WBC: 6.1 10*3/uL (ref 3.4–10.8)

## 2020-06-10 ENCOUNTER — Other Ambulatory Visit: Payer: Self-pay

## 2020-06-10 ENCOUNTER — Encounter: Payer: Self-pay | Admitting: Emergency Medicine

## 2020-06-10 ENCOUNTER — Ambulatory Visit: Payer: Self-pay | Admitting: Emergency Medicine

## 2020-06-10 VITALS — BP 133/91 | HR 90 | Temp 97.3°F | Resp 14 | Ht 72.0 in | Wt 262.0 lb

## 2020-06-10 DIAGNOSIS — Z0000 Encounter for general adult medical examination without abnormal findings: Secondary | ICD-10-CM

## 2020-06-10 NOTE — Progress Notes (Signed)
I have reviewed the triage vital signs and the nursing notes.   HISTORY  Chief Complaint Annual Exam  HPI Bill Campbell is a 54 y.o. male is here for annual exam.      Past Medical History:  Diagnosis Date  . Back pain, chronic   . Colon polyp 06/2014  . Elevated bilirubin   . Elevated lipids   . Family history of colon cancer   . Gout   . Overweight   . Plantar fasciitis     Patient Active Problem List   Diagnosis Date Noted  . History of gout 05/29/2019  . Chronic low back pain without sciatica 05/29/2019  . Low serum HDL 05/29/2019  . Gout 07/20/2014  . Class 2 obesity due to excess calories without serious comorbidity with body mass index (BMI) of 35.0 to 35.9 in adult 07/20/2014    Past Surgical History:  Procedure Laterality Date  . APPENDECTOMY    . KNEE SURGERY    . TONSILLECTOMY AND ADENOIDECTOMY    . VASECTOMY      Prior to Admission medications   Medication Sig Start Date End Date Taking? Authorizing Provider  allopurinol (ZYLOPRIM) 300 MG tablet Take 1 tablet (300 mg total) by mouth daily. 04/18/20  Yes Sable Feil, PA-C  indomethacin (INDOCIN) 25 MG capsule Take 1 capsule (25 mg total) by mouth 3 (three) times daily as needed (Take 1-2 caps up to tid prn, take with food). 05/03/19  Yes Lebron Conners D, MD  naproxen sodium (ALEVE) 220 MG tablet Take 220 mg by mouth.   Yes [provider]    Allergies Penicillins  Family History  Problem Relation Age of Onset  . Hypertension Mother   . Osteoporosis Mother   . Heart attack Father   . Colon cancer Paternal Grandfather     Social History Social History   Tobacco Use  . Smoking status: Never Smoker  . Smokeless tobacco: Current User    Types: Chew  Substance Use Topics  . Alcohol use: Yes    Alcohol/week: 6.0 standard drinks    Types: 6 Cans of beer per week  . Drug use: Never    Review of Systems Constitutional: No fever/chills Eyes: No visual  changes. ENT: No sore throat. Cardiovascular: Denies chest pain. Respiratory: Denies shortness of breath. Gastrointestinal: No abdominal pain.  No nausea, no vomiting.  Genitourinary: Negative for dysuria. Musculoskeletal: History chronic low back pain.  History of gout. Skin: Negative for rash. Neurological: Negative for headaches, focal weakness or numbness. {____________________________________________   PHYSICAL EXAM: Constitutional: Alert and oriented. Well appearing and in no acute distress. Eyes: Conjunctivae are normal. PERRL. EOMI. Head: Atraumatic. Nose: No congestion/rhinnorhea. Neck: No stridor.  Cardiovascular: Normal rate, regular rhythm. Grossly normal heart sounds.  Good peripheral circulation. Respiratory: Normal respiratory effort.  No retractions. Lungs CTAB. Gastrointestinal: Soft and nontender. No distention.  Musculoskeletal: Moves upper and lower extremities with any difficulty.  No point tenderness on palpation of the thoracic or lumbar spine.  Good muscle strength bilaterally.  Able to ambulate without any assistance. Neurologic:  Normal speech and language. No gross focal neurologic deficits are appreciated. No gait instability. Skin:  Skin is warm, dry and intact. No rash noted. Psychiatric: Mood and affect are normal. Speech and behavior are normal.  ____________________________________________   LABS (all labs ordered are listed, but only abnormal results are displayed)  Labs were discussed with patient. ____________________________________________  EKG  Sinus rhythm with ventricular rate of  78. ____________________________________________   FINAL CLINICAL IMPRESSION(S)  Normal annual physical exam.   ED Discharge Orders         Ordered    EKG 12-Lead        06/10/20 1336           Note:  This document was prepared using Dragon voice recognition software and may include unintentional dictation errors.

## 2020-06-14 ENCOUNTER — Ambulatory Visit: Payer: 59

## 2020-10-29 ENCOUNTER — Other Ambulatory Visit: Payer: Self-pay | Admitting: Physician Assistant

## 2020-10-29 DIAGNOSIS — Z8739 Personal history of other diseases of the musculoskeletal system and connective tissue: Secondary | ICD-10-CM

## 2020-11-14 ENCOUNTER — Other Ambulatory Visit: Payer: Self-pay

## 2020-11-14 DIAGNOSIS — Z8739 Personal history of other diseases of the musculoskeletal system and connective tissue: Secondary | ICD-10-CM

## 2020-11-14 MED ORDER — ALLOPURINOL 300 MG PO TABS: 300.0000 mg | ORAL_TABLET | Freq: Every day | ORAL | 1 refills | Status: DC

## 2021-03-27 ENCOUNTER — Other Ambulatory Visit: Payer: Self-pay

## 2021-03-27 ENCOUNTER — Ambulatory Visit (INDEPENDENT_AMBULATORY_CARE_PROVIDER_SITE_OTHER): Payer: 59 | Admitting: Dermatology

## 2021-03-27 DIAGNOSIS — D229 Melanocytic nevi, unspecified: Secondary | ICD-10-CM

## 2021-03-27 DIAGNOSIS — L570 Actinic keratosis: Secondary | ICD-10-CM | POA: Diagnosis not present

## 2021-03-27 DIAGNOSIS — L821 Other seborrheic keratosis: Secondary | ICD-10-CM | POA: Diagnosis not present

## 2021-03-27 DIAGNOSIS — B353 Tinea pedis: Secondary | ICD-10-CM | POA: Diagnosis not present

## 2021-03-27 DIAGNOSIS — L814 Other melanin hyperpigmentation: Secondary | ICD-10-CM | POA: Diagnosis not present

## 2021-03-27 DIAGNOSIS — Z1283 Encounter for screening for malignant neoplasm of skin: Secondary | ICD-10-CM | POA: Diagnosis not present

## 2021-03-27 DIAGNOSIS — L578 Other skin changes due to chronic exposure to nonionizing radiation: Secondary | ICD-10-CM | POA: Diagnosis not present

## 2021-03-27 DIAGNOSIS — D1800 Hemangioma unspecified site: Secondary | ICD-10-CM | POA: Diagnosis not present

## 2021-03-27 MED ORDER — TERBINAFINE HCL 250 MG PO TABS: 250.0000 mg | ORAL_TABLET | Freq: Every day | ORAL | 0 refills | Status: DC

## 2021-03-27 MED ORDER — KETOCONAZOLE 2 % EX CREA: TOPICAL_CREAM | CUTANEOUS | 2 refills | Status: DC

## 2021-03-27 NOTE — Patient Instructions (Addendum)

## 2021-03-27 NOTE — Progress Notes (Signed)
New Patient Visit  Subjective  Bill Campbell is a 55 y.o. male who presents for the following: Annual Exam (Mole check ). Pt c/o spot on the left foot his wife noticed and itchy area on the Left ear. The patient presents for Total-Body Skin Exam (TBSE) for skin cancer screening and mole check.  The following portions of the chart were reviewed this encounter and updated as appropriate:   Tobacco  Allergies  Meds  Problems  Med Hx  Surg Hx  Fam Hx     Review of Systems:  No other skin or systemic complaints except as noted in HPI or Assessment and Plan.  Objective  Well appearing patient in no apparent distress; mood and affect are within normal limits.  A full examination was performed including scalp, head, eyes, ears, nose, lips, neck, chest, axillae, abdomen, back, buttocks, bilateral upper extremities, bilateral lower extremities, hands, feet, fingers, toes, fingernails, and toenails. All findings within normal limits unless otherwise noted below.  Objective  Left Foot - Anterior: Scaling and maceration web spaces and over distal and lateral soles.   Objective  Scalp (16): Erythematous thin papules/macules with gritty scale.    Assessment & Plan  Tinea pedis of left foot Left Foot - Anterior  Chronic and persistent   Start Ketoconazole 2% cream apply to feet at bedtime Start Lamisil 250 mg take 1 tablet daily #30 0 RF   Ordered Medications: ketoconazole (NIZORAL) 2 % cream terbinafine (LAMISIL) 250 MG tablet  AK (actinic keratosis) (16) Scalp  Actinic keratoses are precancerous spots that appear secondary to cumulative UV radiation exposure/sun exposure over time. They are chronic with expected duration over 1 year. A portion of actinic keratoses will progress to squamous cell carcinoma of the skin. It is not possible to reliably predict which spots will progress to skin cancer and so treatment is recommended to prevent development of skin  cancer.  Recommend daily broad spectrum sunscreen SPF 30+ to sun-exposed areas, reapply every 2 hours as needed.  Recommend staying in the shade or wearing long sleeves, sun glasses (UVA+UVB protection) and wide brim hats (4-inch brim around the entire circumference of the hat). Call for new or changing lesions.   Destruction of lesion - Scalp Complexity: simple   Destruction method: cryotherapy   Informed consent: discussed and consent obtained   Timeout:  patient name, date of birth, surgical site, and procedure verified Lesion destroyed using liquid nitrogen: Yes   Region frozen until ice ball extended beyond lesion: Yes   Outcome: patient tolerated procedure well with no complications   Post-procedure details: wound care instructions given    Skin cancer screening   Lentigines - Scattered tan macules - Due to sun exposure - Benign-appering, observe - Recommend daily broad spectrum sunscreen SPF 30+ to sun-exposed areas, reapply every 2 hours as needed. - Call for any changes  Seborrheic Keratoses - Stuck-on, waxy, tan-brown papules and/or plaques  - Benign-appearing - Discussed benign etiology and prognosis. - Observe - Call for any changes  Melanocytic Nevi - Tan-brown and/or pink-flesh-colored symmetric macules and papules - Benign appearing on exam today - Observation - Call clinic for new or changing moles - Recommend daily use of broad spectrum spf 30+ sunscreen to sun-exposed areas.   Hemangiomas - Red papules - Discussed benign nature - Observe - Call for any changes  Actinic Damage - Chronic condition, secondary to cumulative UV/sun exposure - diffuse scaly erythematous macules with underlying dyspigmentation - Recommend daily broad spectrum  sunscreen SPF 30+ to sun-exposed areas, reapply every 2 hours as needed.  - Staying in the shade or wearing long sleeves, sun glasses (UVA+UVB protection) and wide brim hats (4-inch brim around the entire circumference  of the hat) are also recommended for sun protection.  - Call for new or changing lesions.  Actinic Damage - Severe, confluent actinic changes with pre-cancerous actinic keratoses  - Severe, chronic, not at goal, secondary to cumulative UV radiation exposure over time - diffuse scaly erythematous macules and papules with underlying dyspigmentation - Discussed Prescription "Field Treatment" for Severe, Chronic Confluent Actinic Changes with Pre-Cancerous Actinic Keratoses Consider field treatment at the next office visit Field treatment involves treatment of an entire area of skin that has confluent Actinic Changes (Sun/ Ultraviolet light damage) and PreCancerous Actinic Keratoses by method of PhotoDynamic Therapy (PDT) and/or prescription Topical Chemotherapy agents such as 5-fluorouracil, 5-fluorouracil/calcipotriene, and/or imiquimod.  The purpose is to decrease the number of clinically evident and subclinical PreCancerous lesions to prevent progression to development of skin cancer by chemically destroying early precancer changes that may or may not be visible.  It has been shown to reduce the risk of developing skin cancer in the treated area. As a result of treatment, redness, scaling, crusting, and open sores may occur during treatment course. One or more than one of these methods may be used and may have to be used several times to control, suppress and eliminate the PreCancerous changes. Discussed treatment course, expected reaction, and possible side effects. - Recommend daily broad spectrum sunscreen SPF 30+ to sun-exposed areas, reapply every 2 hours as needed.  - Staying in the shade or wearing long sleeves, sun glasses (UVA+UVB protection) and wide brim hats (4-inch brim around the entire circumference of the hat) are also recommended. - Call for new or changing lesions.  Skin cancer screening performed today.  Return in about 1 week (around 04/03/2021) for Tinea pedis, AKs .  I, Marye Round, CMA, am acting as scribe for Sarina Ser, MD .  Documentation: I have reviewed the above documentation for accuracy and completeness, and I agree with the above.  Sarina Ser, MD

## 2021-04-03 ENCOUNTER — Encounter: Payer: Self-pay | Admitting: Dermatology

## 2021-05-05 ENCOUNTER — Ambulatory Visit: Payer: 59 | Admitting: Dermatology

## 2021-05-20 ENCOUNTER — Other Ambulatory Visit: Payer: Self-pay | Admitting: Physician Assistant

## 2021-05-20 DIAGNOSIS — Z8739 Personal history of other diseases of the musculoskeletal system and connective tissue: Secondary | ICD-10-CM

## 2021-06-03 ENCOUNTER — Other Ambulatory Visit: Payer: Self-pay

## 2021-06-03 ENCOUNTER — Ambulatory Visit: Payer: Self-pay | Admitting: Physician Assistant

## 2021-06-03 ENCOUNTER — Ambulatory Visit: Payer: Self-pay

## 2021-06-03 ENCOUNTER — Encounter: Payer: Self-pay | Admitting: Physician Assistant

## 2021-06-03 DIAGNOSIS — Z8739 Personal history of other diseases of the musculoskeletal system and connective tissue: Secondary | ICD-10-CM

## 2021-06-03 DIAGNOSIS — Z0000 Encounter for general adult medical examination without abnormal findings: Secondary | ICD-10-CM

## 2021-06-03 DIAGNOSIS — M7021 Olecranon bursitis, right elbow: Secondary | ICD-10-CM

## 2021-06-03 LAB — POCT URINALYSIS DIPSTICK
Bilirubin, UA: NEGATIVE
Blood, UA: NEGATIVE
Glucose, UA: NEGATIVE
Ketones, UA: NEGATIVE
Leukocytes, UA: NEGATIVE
Nitrite, UA: NEGATIVE
Protein, UA: POSITIVE — AB
Spec Grav, UA: 1.015 (ref 1.010–1.025)
Urobilinogen, UA: 0.2 E.U./dL
pH, UA: 7.5 (ref 5.0–8.0)

## 2021-06-03 MED ORDER — ALLOPURINOL 300 MG PO TABS: 300.0000 mg | ORAL_TABLET | Freq: Every day | ORAL | 1 refills | Status: DC

## 2021-06-03 NOTE — Progress Notes (Signed)
   Subjective: Right olecranon bursitis    Patient ID: Bill Campbell, male    DOB: [birth year], 55 y.o.   MRN: [id number]  HPI Patient presents with right posterior elbow edema for 2 weeks.  Patient said onset of complaint after striking her elbow on a table.  Denies loss sensation or loss of function.  Patient is right-hand dominant.   Review of Systems Count and low back pain.    Objective:   Physical Exam No acute distress.  Patient has mild erythematous swollen to the posterior elbow.       Assessment & Plan: Right olecranon bursitis   Discussed treatment options with patient.  Patient elected to have area aspirated.  Area was surgically clean and 1 cc of lidocaine with epinephrine was instilled.  Using 18-gauge needle and a 10 mL syringe was able to aspirate 80 cc of bloody fluid.  Area was then we cleaned and compressive wrap applied.  Patient given discharge care instructions and advised to follow-up if condition worsens.

## 2021-06-03 NOTE — Progress Notes (Signed)
Pt scheduled to complete physical 06/09/21  Reviewed CDC recommendations for importance of HIV/ Hep C screening once in lifetime. Patient has declined HIV / Hep C screenings today and will let us know if they should change their mind in the future.

## 2021-06-04 LAB — CMP12+LP+TP+TSH+6AC+PSA+CBC…
ALT: 38 IU/L (ref 0–44)
AST: 20 IU/L (ref 0–40)
Albumin/Globulin Ratio: 2.1 (ref 1.2–2.2)
Albumin: 4.6 g/dL (ref 3.8–4.9)
Alkaline Phosphatase: 60 IU/L (ref 44–121)
BUN/Creatinine Ratio: 19 (ref 9–20)
BUN: 19 mg/dL (ref 6–24)
Basophils Absolute: 0.1 10*3/uL (ref 0.0–0.2)
Basos: 1 %
Bilirubin Total: 2.1 mg/dL — ABNORMAL HIGH (ref 0.0–1.2)
Calcium: 9.3 mg/dL (ref 8.7–10.2)
Chloride: 100 mmol/L (ref 96–106)
Chol/HDL Ratio: 5.6 ratio — ABNORMAL HIGH (ref 0.0–5.0)
Cholesterol, Total: 178 mg/dL (ref 100–199)
Creatinine, Ser: 1.01 mg/dL (ref 0.76–1.27)
EOS (ABSOLUTE): 0.1 10*3/uL (ref 0.0–0.4)
Eos: 2 %
Estimated CHD Risk: 1.2 times avg. — ABNORMAL HIGH (ref 0.0–1.0)
Free Thyroxine Index: 2 (ref 1.2–4.9)
GGT: 29 IU/L (ref 0–65)
Globulin, Total: 2.2 g/dL (ref 1.5–4.5)
Glucose: 93 mg/dL (ref 65–99)
HDL: 32 mg/dL — ABNORMAL LOW (ref >39)
Hematocrit: 45.6 % (ref 37.5–51.0)
Hemoglobin: 16.2 g/dL (ref 13.0–17.7)
Immature Grans (Abs): 0 10*3/uL (ref 0.0–0.1)
Immature Granulocytes: 1 %
Iron: 212 ug/dL — ABNORMAL HIGH (ref 38–169)
LDH: 154 IU/L (ref 121–224)
LDL Chol Calc (NIH): 111 mg/dL — ABNORMAL HIGH (ref 0–99)
Lymphocytes Absolute: 1.4 10*3/uL (ref 0.7–3.1)
Lymphs: 28 %
MCH: 32 pg (ref 26.6–33.0)
MCHC: 35.5 g/dL (ref 31.5–35.7)
MCV: 90 fL (ref 79–97)
Monocytes Absolute: 0.3 10*3/uL (ref 0.1–0.9)
Monocytes: 6 %
Neutrophils Absolute: 3.1 10*3/uL (ref 1.4–7.0)
Neutrophils: 62 %
Phosphorus: 2.9 mg/dL (ref 2.8–4.1)
Platelets: 221 10*3/uL (ref 150–450)
Potassium: 4.6 mmol/L (ref 3.5–5.2)
Prostate Specific Ag, Serum: 2.7 ng/mL (ref 0.0–4.0)
RBC: 5.06 x10E6/uL (ref 4.14–5.80)
RDW: 12.2 % (ref 11.6–15.4)
Sodium: 137 mmol/L (ref 134–144)
T3 Uptake Ratio: 32 % (ref 24–39)
T4, Total: 6.1 ug/dL (ref 4.5–12.0)
TSH: 1.56 u[IU]/mL (ref 0.450–4.500)
Total Protein: 6.8 g/dL (ref 6.0–8.5)
Triglycerides: 201 mg/dL — ABNORMAL HIGH (ref 0–149)
Uric Acid: 6.4 mg/dL (ref 3.8–8.4)
VLDL Cholesterol Cal: 35 mg/dL (ref 5–40)
WBC: 5 10*3/uL (ref 3.4–10.8)
eGFR: 88 mL/min/{1.73_m2} (ref >59)

## 2021-06-09 ENCOUNTER — Other Ambulatory Visit: Payer: Self-pay

## 2021-06-09 ENCOUNTER — Encounter: Payer: Self-pay | Admitting: Physician Assistant

## 2021-06-09 ENCOUNTER — Ambulatory Visit: Payer: Self-pay | Admitting: Physician Assistant

## 2021-06-09 VITALS — BP 133/77 | HR 59 | Temp 97.6°F | Resp 14 | Ht 72.0 in | Wt 250.0 lb

## 2021-06-09 DIAGNOSIS — M7021 Olecranon bursitis, right elbow: Secondary | ICD-10-CM

## 2021-06-09 DIAGNOSIS — Z1211 Encounter for screening for malignant neoplasm of colon: Secondary | ICD-10-CM

## 2021-06-09 DIAGNOSIS — Z0000 Encounter for general adult medical examination without abnormal findings: Secondary | ICD-10-CM

## 2021-06-09 NOTE — Addendum Note (Signed)
Addended by: Aliene Altes on: 06/09/2021 03:13 PM   Modules accepted: Orders

## 2021-06-09 NOTE — Progress Notes (Signed)
   Subjective: Annual physical exam    Patient ID: Bill Campbell, male    DOB: May 13, 1966, 55 y.o.   MRN: [id number]  HPI Patient presents annual physical exam.  Patient was concerned for swollen posterior right elbow.  Patient also requests consult for colonoscopy.  States last exam was greater than 10 years.   Review of Systems Chronic back pain, colon polyps, elevated bilirubin, elevated lipid, gout, and plantar fasciitis.    Objective:   Physical Exam No acute distress.  Temperature is 97.6, pulse 59, respiration 14, BP is 132/77, and patient 94% O2 sat on room air.  Patient weighs 250 pounds a 12 pound weight loss from last year. HEENT is unremarkable.  Neck is supple without adenopathy or bruits.  Lungs are clear to auscultation.  Heart is regular rate and rhythm.  No acute findings on EKG. Abdomen is negative HSM, normoactive bowel sounds, soft, nontender to palpation. No obvious deformity to the upper extremities.  Patient has edema consistent with olecranon bursitis to the right elbow.  No obvious deformity to the lower extremities.  Patient has full and equal range of motion of the upper and lower extremities. No obvious cervical or lumbar spine deformity.  Patient has full and equal range of motion of the cervical and lumbar spine. Cranial nerves II through XII grossly intact.  DTRs are 2+ without clonus.       Assessment & Plan:   Patient lipid profile has improved with diet and exercise.  Advised to continue previous regimen.  No medication for hyperlipidemia warranted at this time.  Patient given a consult to orthopedics.  Patient also given a consult for colonoscopy.  Follow-up as necessary.

## 2021-06-09 NOTE — Progress Notes (Signed)
Reviewed CDC recommendations for importance of HIV/ Hep C screening once in lifetime. Patient has declined HIV / Hep C screenings today and will let us know if they should change their mind in the future.   Reviewed CDC recommendations for importance of the COVID-19 Vaccine. Pt has declined the vaccine today and for the future.

## 2021-07-04 DIAGNOSIS — M7021 Olecranon bursitis, right elbow: Secondary | ICD-10-CM | POA: Diagnosis not present

## 2021-08-18 ENCOUNTER — Ambulatory Visit: Payer: 59 | Admitting: Dermatology

## 2021-12-08 ENCOUNTER — Other Ambulatory Visit: Payer: Self-pay | Admitting: Physician Assistant

## 2021-12-08 DIAGNOSIS — Z8739 Personal history of other diseases of the musculoskeletal system and connective tissue: Secondary | ICD-10-CM

## 2021-12-11 ENCOUNTER — Other Ambulatory Visit: Payer: Self-pay

## 2021-12-11 NOTE — Telephone Encounter (Signed)
Bill Campbell called to follow-up on text he rec'd from [facility] that his provider hadn't sent back a refill request.  Went to Rx refill in Epic & a pended Rx refill request for Allopurinol 300 mg was sent electronically by patient's pharmacy to [facility] ED.  Re-routed the pended Rx refill request to Randel Pigg, PA-C.  AMD

## 2022-08-06 DIAGNOSIS — M48061 Spinal stenosis, lumbar region without neurogenic claudication: Secondary | ICD-10-CM | POA: Diagnosis not present

## 2022-08-12 DIAGNOSIS — M48061 Spinal stenosis, lumbar region without neurogenic claudication: Secondary | ICD-10-CM | POA: Diagnosis not present

## 2022-08-12 DIAGNOSIS — M5126 Other intervertebral disc displacement, lumbar region: Secondary | ICD-10-CM | POA: Diagnosis not present

## 2022-08-14 ENCOUNTER — Ambulatory Visit: Payer: Self-pay

## 2022-08-14 DIAGNOSIS — Z0000 Encounter for general adult medical examination without abnormal findings: Secondary | ICD-10-CM

## 2022-08-14 LAB — POCT URINALYSIS DIPSTICK
Bilirubin, UA: NEGATIVE
Blood, UA: NEGATIVE
Glucose, UA: NEGATIVE
Ketones, UA: NEGATIVE
Leukocytes, UA: NEGATIVE
Nitrite, UA: NEGATIVE
Protein, UA: NEGATIVE
Spec Grav, UA: 1.025 (ref 1.010–1.025)
Urobilinogen, UA: 0.2 E.U./dL
pH, UA: 6 (ref 5.0–8.0)

## 2022-08-15 LAB — CMP12+LP+TP+TSH+6AC+PSA+CBC…
ALT: 48 IU/L — ABNORMAL HIGH (ref 0–44)
AST: 23 IU/L (ref 0–40)
Albumin/Globulin Ratio: 2 (ref 1.2–2.2)
Albumin: 4.5 g/dL (ref 3.8–4.9)
Alkaline Phosphatase: 59 IU/L (ref 44–121)
BUN/Creatinine Ratio: 19 (ref 9–20)
BUN: 17 mg/dL (ref 6–24)
Basophils Absolute: 0.1 10*3/uL (ref 0.0–0.2)
Basos: 2 %
Bilirubin Total: 1.8 mg/dL — ABNORMAL HIGH (ref 0.0–1.2)
Calcium: 8.9 mg/dL (ref 8.7–10.2)
Chloride: 102 mmol/L (ref 96–106)
Chol/HDL Ratio: 5.6 ratio — ABNORMAL HIGH (ref 0.0–5.0)
Cholesterol, Total: 184 mg/dL (ref 100–199)
Creatinine, Ser: 0.89 mg/dL (ref 0.76–1.27)
EOS (ABSOLUTE): 0.1 10*3/uL (ref 0.0–0.4)
Eos: 2 %
Estimated CHD Risk: 1.2 times avg. — ABNORMAL HIGH (ref 0.0–1.0)
Free Thyroxine Index: 1.8 (ref 1.2–4.9)
GGT: 40 IU/L (ref 0–65)
Globulin, Total: 2.2 g/dL (ref 1.5–4.5)
Glucose: 87 mg/dL (ref 70–99)
HDL: 33 mg/dL — ABNORMAL LOW (ref >39)
Hematocrit: 48 % (ref 37.5–51.0)
Hemoglobin: 16.3 g/dL (ref 13.0–17.7)
Immature Grans (Abs): 0 10*3/uL (ref 0.0–0.1)
Immature Granulocytes: 1 %
Iron: 125 ug/dL (ref 38–169)
LDH: 169 IU/L (ref 121–224)
LDL Chol Calc (NIH): 121 mg/dL — ABNORMAL HIGH (ref 0–99)
Lymphocytes Absolute: 1.7 10*3/uL (ref 0.7–3.1)
Lymphs: 30 %
MCH: 31.1 pg (ref 26.6–33.0)
MCHC: 34 g/dL (ref 31.5–35.7)
MCV: 92 fL (ref 79–97)
Monocytes Absolute: 0.4 10*3/uL (ref 0.1–0.9)
Monocytes: 7 %
Neutrophils Absolute: 3.2 10*3/uL (ref 1.4–7.0)
Neutrophils: 58 %
Phosphorus: 2.8 mg/dL (ref 2.8–4.1)
Platelets: 222 10*3/uL (ref 150–450)
Potassium: 4.9 mmol/L (ref 3.5–5.2)
Prostate Specific Ag, Serum: 3.4 ng/mL (ref 0.0–4.0)
RBC: 5.24 x10E6/uL (ref 4.14–5.80)
RDW: 12.3 % (ref 11.6–15.4)
Sodium: 140 mmol/L (ref 134–144)
T3 Uptake Ratio: 28 % (ref 24–39)
T4, Total: 6.4 ug/dL (ref 4.5–12.0)
TSH: 1.44 u[IU]/mL (ref 0.450–4.500)
Total Protein: 6.7 g/dL (ref 6.0–8.5)
Triglycerides: 168 mg/dL — ABNORMAL HIGH (ref 0–149)
Uric Acid: 7.2 mg/dL (ref 3.8–8.4)
VLDL Cholesterol Cal: 30 mg/dL (ref 5–40)
WBC: 5.5 10*3/uL (ref 3.4–10.8)
eGFR: 101 mL/min/{1.73_m2} (ref >59)

## 2022-08-19 ENCOUNTER — Encounter: Payer: Self-pay | Admitting: Physician Assistant

## 2022-08-19 ENCOUNTER — Ambulatory Visit: Payer: Self-pay | Admitting: Physician Assistant

## 2022-08-19 VITALS — BP 137/87 | HR 71 | Temp 97.6°F | Resp 14 | Ht 72.0 in | Wt 260.0 lb

## 2022-08-19 DIAGNOSIS — N528 Other male erectile dysfunction: Secondary | ICD-10-CM

## 2022-08-19 DIAGNOSIS — Z0000 Encounter for general adult medical examination without abnormal findings: Secondary | ICD-10-CM

## 2022-08-19 MED ORDER — TADALAFIL 20 MG PO TABS: 10.0000 mg | ORAL_TABLET | ORAL | 11 refills | Status: DC | PRN

## 2022-08-19 NOTE — Progress Notes (Signed)
[facility]  ____________________________________________   None    (approximate)  I have reviewed the triage vital signs and the nursing notes.   HISTORY  Chief Complaint Annual Exam   HPI Bill Campbell is a 56 y.o. male patient presents for annual physical exam.  Patient voiced concern for erectile dysfunction.  Patient described his concern is inability to maintain erection for sexual intercourse.         Past Medical History:  Diagnosis Date   Back pain, chronic    Colon polyp 06/2014   Elevated bilirubin    Elevated lipids    Family history of colon cancer    Gout    Overweight    Plantar fasciitis     Patient Active Problem List   Diagnosis Date Noted   History of gout 05/29/2019   Chronic low back pain without sciatica 05/29/2019   Low serum HDL 05/29/2019   Gout 07/20/2014   Class 2 obesity due to excess calories without serious comorbidity with body mass index (BMI) of 35.0 to 35.9 in adult 07/20/2014    Past Surgical History:  Procedure Laterality Date   APPENDECTOMY     KNEE SURGERY     TONSILLECTOMY AND ADENOIDECTOMY     VASECTOMY      Prior to Admission medications   Medication Sig Start Date End Date Taking? Authorizing Provider  acetaminophen (TYLENOL) 325 MG tablet Take 650 mg by mouth every 6 (six) hours as needed.   Yes [provider]  allopurinol (ZYLOPRIM) 300 MG tablet TAKE 1 TABLET BY MOUTH EVERY DAY 12/11/21  Yes Sable Feil, PA-C  indomethacin (INDOCIN) 25 MG capsule Take 1 capsule (25 mg total) by mouth 3 (three) times daily as needed (Take 1-2 caps up to tid prn, take with food). 05/03/19  Yes Towanda Malkin, MD    Allergies Penicillins  Family History  Problem Relation Age of Onset   Hypertension Mother    Osteoporosis Mother    Heart attack Father    Colon cancer Paternal Grandfather     Social History Social History   Tobacco Use   Smoking status: Never    Smokeless tobacco: Current    Types: Chew  Substance Use Topics   Alcohol use: Yes    Alcohol/week: 6.0 standard drinks of alcohol    Types: 6 Cans of beer per week   Drug use: Never    Review of Systems Constitutional: No fever/chills Eyes: No visual changes. ENT: No sore throat. Cardiovascular: Denies chest pain. Respiratory: Denies shortness of breath. Gastrointestinal: No abdominal pain.  No nausea, no vomiting.  No diarrhea.  No constipation. Genitourinary: Negative for dysuria.  Erectile dysfunction Musculoskeletal: Negative for back pain. Skin: Negative for rash. Neurological: Negative for headaches, focal weakness or numbness. Endocrine: Elevated lipids and gout Allergic/Immunilogical: Penicillin ____________________________________________   PHYSICAL EXAM:  VITAL SIGNS: BP is 137/87, pulse 71, respiration 14, temperature 97.6, patient 90% O2 sat on room air.  Patient weighs 260 pounds and BMI is 35.26. Constitutional: Alert and oriented. Well appearing and in no acute distress. Eyes: Conjunctivae are normal. PERRL. EOMI. Head: Atraumatic. Nose: No congestion/rhinnorhea. Mouth/Throat: Mucous membranes are moist.  Oropharynx non-erythematous. Neck: No stridor.  No cervical spine tenderness to palpation. Hematological/Lymphatic/Immunilogical: No cervical lymphadenopathy. Cardiovascular: Normal rate, regular rhythm. Grossly normal heart sounds.  Good peripheral circulation. Respiratory: Normal respiratory effort.  No retractions. Lungs CTAB. Gastrointestinal: Soft and nontender. No distention. No abdominal bruits. No CVA  tenderness. Genitourinary: Deferred Musculoskeletal: No lower extremity tenderness nor edema.  No joint effusions. Neurologic:  Normal speech and language. No gross focal neurologic deficits are appreciated. No gait instability. Skin:  Skin is warm, dry and intact. No rash noted. Psychiatric: Mood and affect are normal. Speech and behavior are  normal.  ____________________________________________   LABS        Component Ref Range & Units 5 d ago 1 yr ago 2 yr ago 3 yr ago  Color, UA  Dark Yellow  amber  yellow  Amber   Clarity, UA  Clear  clear  cloudy  Clear   Glucose, UA Negative Negative  Negative  Negative  Negative   Bilirubin, UA  Negative  negative  negative  Negative   Ketones, UA  Negative  negative  negative  1+ CM   Spec Grav, UA 1.010 - 1.025 1.025  1.015  1.025  1.015   Blood, UA  Negative  negative  negative  Negative   pH, UA 5.0 - 8.0 6.0  7.5  6.0  8.0   Protein, UA Negative Negative  Positive Abnormal  CM  Positive Abnormal  CM  Negative   Urobilinogen, UA 0.2 or 1.0 E.U./dL 0.2  0.2  0.2  0.2   Nitrite, UA  Negative  negative  negative  Negative   Leukocytes, UA Negative Negative  Negative  Negative  Negative   Appearance   dark  dark     Odor                          Other Results from 08/14/2022   Contains abnormal data CMP12+LP+TP+TSH+6AC+PSA+CBC. Order: [id number] Status: Final result    Visible to patient: Yes (seen)    Next appt: None    Dx: Routine adult health maintenance    0 Result Notes           Component Ref Range & Units 5 d ago (08/14/22) 1 yr ago (06/03/21) 2 yr ago (05/28/20) 3 yr ago (05/26/19) 4 yr ago (01/26/18) 4 yr ago (01/26/18) 5 yr ago (01/21/17)  Glucose 70 - 99 mg/dL 87  93 R  97 R  104 High  R      Uric Acid 3.8 - 8.4 mg/dL 7.2  6.4 CM  6.8 CM  5.3 R, CM  8.9 R   9.9 R   Comment:            Therapeutic target for gout patients: <6.0  BUN 6 - 24 mg/dL '17  19  18  18      ' Creatinine, Ser 0.76 - 1.27 mg/dL 0.89  1.01  1.05  1.02      eGFR >59 mL/min/1.73 101  88        BUN/Creatinine Ratio 9 - '20 19  19  17  18      ' Sodium 134 - 144 mmol/L 140  137  139  136      Potassium 3.5 - 5.2 mmol/L 4.9  4.6  5.1  4.5      Chloride 96 - 106 mmol/L 102  100  101  100      Calcium 8.7 - 10.2 mg/dL 8.9  9.3  9.3  9.2      Phosphorus 2.8 - 4.1 mg/dL 2.8  2.9  3.0  3.0       Total Protein 6.0 - 8.5 g/dL 6.7  6.8  6.3  6.2  Albumin 3.8 - 4.9 g/dL 4.5  4.6  4.2  4.3      Globulin, Total 1.5 - 4.5 g/dL 2.2  2.2  2.1  1.9      Albumin/Globulin Ratio 1.2 - 2.2 2.0  2.1  2.0  2.3 High       Bilirubin Total 0.0 - 1.2 mg/dL 1.8 High   2.1 High   1.1  1.1      Alkaline Phosphatase 44 - 121 IU/L 59  60  61 R  57 R      LDH 121 - 224 IU/L 169  154  159  135      AST 0 - 40 IU/L '23  20  19  16      ' ALT 0 - 44 IU/L 48 High   38  30  27      GGT 0 - 65 IU/L 40  29  35  19      Iron 38 - 169 ug/dL 125  212 High   140  92      Cholesterol, Total 100 - 199 mg/dL 184  178  188  162      Triglycerides 0 - 149 mg/dL 168 High   201 High   277 High   168 High    161 Abnormal  R    HDL >39 mg/dL 33 Low   32 Low   29 Low   30 Low    30 Abnormal  R    VLDL Cholesterol Cal 5 - 40 mg/dL 30  35  48 High   34      LDL Chol Calc (NIH) 0 - 99 mg/dL 121 High   111 High   111 High        Chol/HDL Ratio 0.0 - 5.0 ratio 5.6 High   5.6 High  CM  6.5 High  CM  5.4 High  CM      Comment:                                   T. Chol/HDL Ratio                                              Men  Women                                1/2 Avg.Risk  3.4    3.3                                    Avg.Risk  5.0    4.4                                 2X Avg.Risk  9.6    7.1                                 3X Avg.Risk 23.4   11.0   Estimated CHD Risk 0.0 - 1.0 times avg. 1.2 High   1.2 High  CM  1.4 High  CM  1.1 High  CM      Comment: The CHD Risk is based on the T. Chol/HDL ratio. Other  factors affect CHD Risk such as hypertension, smoking,  diabetes, severe obesity, and family history of  premature CHD.   TSH 0.450 - 4.500 uIU/mL 1.440  1.560  1.680  1.720      T4, Total 4.5 - 12.0 ug/dL 6.4  6.1  6.0  6.6      T3 Uptake Ratio 24 - 39 % 28  32  29  28      Free Thyroxine Index 1.2 - 4.9 1.8  2.0  1.7  1.8      Prostate Specific Ag, Serum 0.0 - 4.0 ng/mL 3.4  2.7 CM  2.0 CM  1.5 CM      Comment:  Roche ECLIA methodology.  According to the American Urological Association, Serum PSA should  decrease and remain at undetectable levels after radical  prostatectomy. The AUA defines biochemical recurrence as an initial  PSA value 0.2 ng/mL or greater followed by a subsequent confirmatory  PSA value 0.2 ng/mL or greater.  Values obtained with different assay methods or kits cannot be used  interchangeably. Results cannot be interpreted as absolute evidence  of the presence or absence of malignant disease.   WBC 3.4 - 10.8 x10E3/uL 5.5  5.0  6.1  5.0      RBC 4.14 - 5.80 x10E6/uL 5.24  5.06  5.21  5.21      Hemoglobin 13.0 - 17.7 g/dL 16.3  16.2  15.9  15.9      Hematocrit 37.5 - 51.0 % 48.0  45.6  46.2  44.9      MCV 79 - 97 fL 92  90  89  86      MCH 26.6 - 33.0 pg 31.1  32.0  30.5  30.5      MCHC 31.5 - 35.7 g/dL 34.0  35.5  34.4  35.4      RDW 11.6 - 15.4 % 12.3  12.2  12.3  12.3      Platelets 150 - 450 x10E3/uL 222  221  248  222      Neutrophils Not Estab. % 58  62  62  59      Lymphs Not Estab. % '30  28  28  31      ' Monocytes Not Estab. % '7  6  6  6      ' Eos Not Estab. % '2  2  2  2      ' Basos Not Estab. % '2  1  1  1      ' Neutrophils Absolute 1.4 - 7.0 x10E3/uL 3.2  3.1  3.8  2.9      Lymphocytes Absolute 0.7 - 3.1 x10E3/uL 1.7  1.4  1.7  1.6      Monocytes Absolute 0.1 - 0.9 x10E3/uL 0.4  0.3  0.4  0.3      EOS (ABSOLUTE) 0.0 - 0.4 x10E3/uL 0.1  0.1  0.1  0.1      Basophils Absolute 0.0 - 0.2 x10E3/uL 0.1  0.1  0.1  0.1      Immature Granulocytes Not Estab. % '1  1  1  1      ' Immature Grans              ____________________________________________  EKG Normal sinus rhythm 60 bpm  ____________________________________________    ____________________________________________   INITIAL IMPRESSION / ASSESSMENT  AND PLAN   .As part of my medical decision making, I reviewed the following data within the [place]      Discussed lab results with patient.   We will continue to monitor lipid profile and rising PSA levels.  Patient given a prescription for trial of Cialis for erectile dysfunction.        ____________________________________________   FINAL CLINICAL IMPRESSION(S) / ED DIAGNOSES  Well exam Erectile dysfunction  ED Discharge Orders     None        Note:  This document was prepared using Dragon voice recognition software and may include unintentional dictation errors.

## 2022-08-19 NOTE — Progress Notes (Signed)
Pt presents today to complete physical. Pt denies any issues or concerns at this time./CL,RMA 

## 2022-08-27 DIAGNOSIS — M48061 Spinal stenosis, lumbar region without neurogenic claudication: Secondary | ICD-10-CM | POA: Diagnosis not present

## 2022-08-27 DIAGNOSIS — Z6836 Body mass index (BMI) 36.0-36.9, adult: Secondary | ICD-10-CM | POA: Diagnosis not present

## 2022-09-04 ENCOUNTER — Telehealth: Payer: Self-pay

## 2022-09-04 DIAGNOSIS — R1310 Dysphagia, unspecified: Secondary | ICD-10-CM

## 2022-09-04 NOTE — Telephone Encounter (Signed)
Bill Campbell! Could I get a referral for this gastro doc. She is my daughters doc and we love her. I'm thinking I may have a hiatal hernia. Her name is Dr Georgina Quint.   Georgina Quint, MD Bill Campbell [id number]  [phone]  Thanks   [place] [facility] [place]  Athletic Maintenance   Ronalee Belts sent the above email requesting a GI referral.  Janalyn Harder to see what symptoms he's having.  States when he eats he's experiencing some difficulty with swallowing & feeling like the food is stuck in his throat.  He's had to make himself vomit to get it out.  Then he has a lot of belching & hiccups.  States 1st episode was approximately 6 months ago.  Most recent episode was last night eating pizza at a football game.  Says the episodes are starting to get more frequent.  Said he has a habit of eating fast & that's he's purposely tried to slow down & chew food more and drink more water  Elna Breslow, RN

## 2022-09-07 DIAGNOSIS — M48061 Spinal stenosis, lumbar region without neurogenic claudication: Secondary | ICD-10-CM | POA: Diagnosis not present

## 2022-09-07 DIAGNOSIS — M5416 Radiculopathy, lumbar region: Secondary | ICD-10-CM | POA: Diagnosis not present

## 2022-09-23 ENCOUNTER — Telehealth: Payer: Self-pay

## 2022-09-23 DIAGNOSIS — R1310 Dysphagia, unspecified: Secondary | ICD-10-CM

## 2022-09-23 NOTE — Telephone Encounter (Signed)
[facility] is fine. I've been having problems eating. It gets stuck and I have to throw it up. It sucks  [place]  Athletic Maintenance   From: Elna Breslow '@[email or web address]> Sent: Monday, September 21, 2022 1:24 PM To: Dayton Scrape '@[email or web address]> Subject: RE: Dennis Bast have successfully delivered a fax to [phone]    Who do you want? [facility], [facility] ([facility] & [facility] in [place]..these are one's we've sent people to in the past.   From: [place] Scrape '@[email or web address]>  Sent: Monday, September 21, 2022 12:29 PM To: Shyvonne Chastang '@[email or web address]> Subject: Re: You have successfully delivered a fax to [phone]   Montel Culver, I still haven't heard anything from the [facility]. Can you refer me to someone else?    [place]  Athletic Maintenance   From: [place] Scrape '@[email or web address]> Sent: Friday, September 04, 2022 11:28 AM To: Elna Breslow '@[email or web address]> Subject: Re: You have successfully delivered a fax to [phone]    Thanks again!!   [place] [facility]

## 2022-10-13 DIAGNOSIS — Z6836 Body mass index (BMI) 36.0-36.9, adult: Secondary | ICD-10-CM | POA: Diagnosis not present

## 2022-10-13 DIAGNOSIS — M48061 Spinal stenosis, lumbar region without neurogenic claudication: Secondary | ICD-10-CM | POA: Diagnosis not present

## 2022-10-15 DIAGNOSIS — M48061 Spinal stenosis, lumbar region without neurogenic claudication: Secondary | ICD-10-CM | POA: Diagnosis not present

## 2022-10-22 DIAGNOSIS — M48061 Spinal stenosis, lumbar region without neurogenic claudication: Secondary | ICD-10-CM | POA: Diagnosis not present

## 2022-10-23 DIAGNOSIS — M48061 Spinal stenosis, lumbar region without neurogenic claudication: Secondary | ICD-10-CM | POA: Diagnosis not present

## 2022-10-28 DIAGNOSIS — M48061 Spinal stenosis, lumbar region without neurogenic claudication: Secondary | ICD-10-CM | POA: Diagnosis not present

## 2022-10-30 DIAGNOSIS — M48061 Spinal stenosis, lumbar region without neurogenic claudication: Secondary | ICD-10-CM | POA: Diagnosis not present

## 2022-11-04 DIAGNOSIS — M48061 Spinal stenosis, lumbar region without neurogenic claudication: Secondary | ICD-10-CM | POA: Diagnosis not present

## 2022-11-06 DIAGNOSIS — M48061 Spinal stenosis, lumbar region without neurogenic claudication: Secondary | ICD-10-CM | POA: Diagnosis not present

## 2022-11-11 DIAGNOSIS — M48061 Spinal stenosis, lumbar region without neurogenic claudication: Secondary | ICD-10-CM | POA: Diagnosis not present

## 2022-11-13 DIAGNOSIS — M48061 Spinal stenosis, lumbar region without neurogenic claudication: Secondary | ICD-10-CM | POA: Diagnosis not present

## 2022-11-24 DIAGNOSIS — M48061 Spinal stenosis, lumbar region without neurogenic claudication: Secondary | ICD-10-CM | POA: Diagnosis not present

## 2022-11-26 DIAGNOSIS — M48061 Spinal stenosis, lumbar region without neurogenic claudication: Secondary | ICD-10-CM | POA: Diagnosis not present

## 2022-12-08 DIAGNOSIS — M48061 Spinal stenosis, lumbar region without neurogenic claudication: Secondary | ICD-10-CM | POA: Diagnosis not present

## 2022-12-10 DIAGNOSIS — M48061 Spinal stenosis, lumbar region without neurogenic claudication: Secondary | ICD-10-CM | POA: Diagnosis not present

## 2022-12-15 DIAGNOSIS — M48061 Spinal stenosis, lumbar region without neurogenic claudication: Secondary | ICD-10-CM | POA: Diagnosis not present

## 2022-12-17 DIAGNOSIS — M48061 Spinal stenosis, lumbar region without neurogenic claudication: Secondary | ICD-10-CM | POA: Diagnosis not present

## 2022-12-21 DIAGNOSIS — M48061 Spinal stenosis, lumbar region without neurogenic claudication: Secondary | ICD-10-CM | POA: Diagnosis not present

## 2022-12-24 DIAGNOSIS — M48061 Spinal stenosis, lumbar region without neurogenic claudication: Secondary | ICD-10-CM | POA: Diagnosis not present

## 2022-12-28 DIAGNOSIS — M48061 Spinal stenosis, lumbar region without neurogenic claudication: Secondary | ICD-10-CM | POA: Diagnosis not present

## 2022-12-31 DIAGNOSIS — M48061 Spinal stenosis, lumbar region without neurogenic claudication: Secondary | ICD-10-CM | POA: Diagnosis not present

## 2023-01-01 DIAGNOSIS — M48061 Spinal stenosis, lumbar region without neurogenic claudication: Secondary | ICD-10-CM | POA: Diagnosis not present

## 2023-01-04 DIAGNOSIS — M48061 Spinal stenosis, lumbar region without neurogenic claudication: Secondary | ICD-10-CM | POA: Diagnosis not present

## 2023-01-06 DIAGNOSIS — R1319 Other dysphagia: Secondary | ICD-10-CM | POA: Diagnosis not present

## 2023-01-06 DIAGNOSIS — K219 Gastro-esophageal reflux disease without esophagitis: Secondary | ICD-10-CM | POA: Diagnosis not present

## 2023-01-06 DIAGNOSIS — Z1211 Encounter for screening for malignant neoplasm of colon: Secondary | ICD-10-CM | POA: Diagnosis not present

## 2023-02-03 ENCOUNTER — Other Ambulatory Visit: Payer: Self-pay | Admitting: Physician Assistant

## 2023-02-03 DIAGNOSIS — Z8739 Personal history of other diseases of the musculoskeletal system and connective tissue: Secondary | ICD-10-CM

## 2023-02-09 ENCOUNTER — Other Ambulatory Visit: Payer: Self-pay

## 2023-02-09 DIAGNOSIS — Z8739 Personal history of other diseases of the musculoskeletal system and connective tissue: Secondary | ICD-10-CM

## 2023-02-10 MED ORDER — ALLOPURINOL 300 MG PO TABS: 300.0000 mg | ORAL_TABLET | Freq: Every day | ORAL | 3 refills | Status: DC

## 2023-02-22 ENCOUNTER — Ambulatory Visit: Payer: 59

## 2023-02-22 DIAGNOSIS — K5730 Diverticulosis of large intestine without perforation or abscess without bleeding: Secondary | ICD-10-CM | POA: Diagnosis not present

## 2023-02-22 DIAGNOSIS — Z1211 Encounter for screening for malignant neoplasm of colon: Secondary | ICD-10-CM | POA: Diagnosis not present

## 2023-02-22 DIAGNOSIS — R1319 Other dysphagia: Secondary | ICD-10-CM | POA: Diagnosis not present

## 2023-02-22 DIAGNOSIS — K2289 Other specified disease of esophagus: Secondary | ICD-10-CM | POA: Diagnosis not present

## 2023-02-22 DIAGNOSIS — R1314 Dysphagia, pharyngoesophageal phase: Secondary | ICD-10-CM | POA: Diagnosis not present

## 2023-02-22 DIAGNOSIS — K219 Gastro-esophageal reflux disease without esophagitis: Secondary | ICD-10-CM | POA: Diagnosis not present

## 2023-02-22 DIAGNOSIS — K222 Esophageal obstruction: Secondary | ICD-10-CM | POA: Diagnosis not present

## 2023-02-22 DIAGNOSIS — K640 First degree hemorrhoids: Secondary | ICD-10-CM | POA: Diagnosis not present

## 2023-04-14 ENCOUNTER — Other Ambulatory Visit: Payer: Self-pay | Admitting: Orthopedic Surgery

## 2023-04-14 DIAGNOSIS — M25511 Pain in right shoulder: Secondary | ICD-10-CM

## 2023-04-14 DIAGNOSIS — M542 Cervicalgia: Secondary | ICD-10-CM | POA: Diagnosis not present

## 2023-04-14 DIAGNOSIS — M47812 Spondylosis without myelopathy or radiculopathy, cervical region: Secondary | ICD-10-CM | POA: Diagnosis not present

## 2023-05-05 ENCOUNTER — Ambulatory Visit
Admission: RE | Admit: 2023-05-05 | Discharge: 2023-05-05 | Disposition: A | Discharge status: Home / Self Care | Payer: 59 | Source: Ambulatory Visit | Attending: Orthopedic Surgery | Admitting: Orthopedic Surgery

## 2023-05-05 DIAGNOSIS — M25511 Pain in right shoulder: Secondary | ICD-10-CM

## 2023-05-05 DIAGNOSIS — M19011 Primary osteoarthritis, right shoulder: Secondary | ICD-10-CM | POA: Diagnosis not present

## 2023-05-05 DIAGNOSIS — M7551 Bursitis of right shoulder: Secondary | ICD-10-CM | POA: Diagnosis not present

## 2023-05-05 DIAGNOSIS — S46011A Strain of muscle(s) and tendon(s) of the rotator cuff of right shoulder, initial encounter: Secondary | ICD-10-CM | POA: Diagnosis not present

## 2023-05-10 DIAGNOSIS — M75121 Complete rotator cuff tear or rupture of right shoulder, not specified as traumatic: Secondary | ICD-10-CM | POA: Diagnosis not present

## 2023-09-07 ENCOUNTER — Other Ambulatory Visit: Payer: Self-pay | Admitting: Orthopedic Surgery

## 2023-09-13 ENCOUNTER — Ambulatory Visit: Payer: Self-pay

## 2023-09-13 DIAGNOSIS — Z0000 Encounter for general adult medical examination without abnormal findings: Secondary | ICD-10-CM

## 2023-09-14 ENCOUNTER — Ambulatory Visit: Payer: 59 | Admitting: Physician Assistant

## 2023-09-14 LAB — CMP12+LP+TP+TSH+6AC+PSA+CBC…
ALT: 24 [IU]/L (ref 0–44)
AST: 19 [IU]/L (ref 0–40)
Albumin: 4.7 g/dL (ref 3.8–4.9)
Alkaline Phosphatase: 65 [IU]/L (ref 44–121)
BUN/Creatinine Ratio: 14 (ref 9–20)
BUN: 14 mg/dL (ref 6–24)
Basophils Absolute: 0.1 10*3/uL (ref 0.0–0.2)
Basos: 1 %
Bilirubin Total: 1.1 mg/dL (ref 0.0–1.2)
Calcium: 9.2 mg/dL (ref 8.7–10.2)
Chloride: 102 mmol/L (ref 96–106)
Chol/HDL Ratio: 4.9 ratio (ref 0.0–5.0)
Cholesterol, Total: 182 mg/dL (ref 100–199)
Creatinine, Ser: 1.02 mg/dL (ref 0.76–1.27)
EOS (ABSOLUTE): 0.1 10*3/uL (ref 0.0–0.4)
Eos: 2 %
Estimated CHD Risk: 1 times avg. (ref 0.0–1.0)
Free Thyroxine Index: 2.1 (ref 1.2–4.9)
GGT: 34 [IU]/L (ref 0–65)
Globulin, Total: 2.4 g/dL (ref 1.5–4.5)
Glucose: 95 mg/dL (ref 70–99)
HDL: 37 mg/dL — ABNORMAL LOW (ref >39)
Hematocrit: 50 % (ref 37.5–51.0)
Hemoglobin: 17 g/dL (ref 13.0–17.7)
Immature Grans (Abs): 0.1 10*3/uL (ref 0.0–0.1)
Immature Granulocytes: 1 %
Iron: 102 ug/dL (ref 38–169)
LDH: 171 [IU]/L (ref 121–224)
LDL Chol Calc (NIH): 112 mg/dL — ABNORMAL HIGH (ref 0–99)
Lymphocytes Absolute: 1.9 10*3/uL (ref 0.7–3.1)
Lymphs: 33 %
MCH: 30.9 pg (ref 26.6–33.0)
MCHC: 34 g/dL (ref 31.5–35.7)
MCV: 91 fL (ref 79–97)
Monocytes Absolute: 0.3 10*3/uL (ref 0.1–0.9)
Monocytes: 6 %
Neutrophils Absolute: 3.3 10*3/uL (ref 1.4–7.0)
Neutrophils: 57 %
Phosphorus: 2.9 mg/dL (ref 2.8–4.1)
Platelets: 273 10*3/uL (ref 150–450)
Potassium: 4.9 mmol/L (ref 3.5–5.2)
Prostate Specific Ag, Serum: 5.5 ng/mL — ABNORMAL HIGH (ref 0.0–4.0)
RBC: 5.5 x10E6/uL (ref 4.14–5.80)
RDW: 11.9 % (ref 11.6–15.4)
Sodium: 141 mmol/L (ref 134–144)
T3 Uptake Ratio: 29 % (ref 24–39)
T4, Total: 7.4 ug/dL (ref 4.5–12.0)
TSH: 1.43 u[IU]/mL (ref 0.450–4.500)
Total Protein: 7.1 g/dL (ref 6.0–8.5)
Triglycerides: 185 mg/dL — ABNORMAL HIGH (ref 0–149)
Uric Acid: 6 mg/dL (ref 3.8–8.4)
VLDL Cholesterol Cal: 33 mg/dL (ref 5–40)
WBC: 5.8 10*3/uL (ref 3.4–10.8)
eGFR: 86 mL/min/{1.73_m2} (ref >59)

## 2023-09-15 ENCOUNTER — Other Ambulatory Visit: Payer: Self-pay | Admitting: Physician Assistant

## 2023-09-15 ENCOUNTER — Encounter: Payer: Self-pay | Admitting: Physician Assistant

## 2023-09-15 ENCOUNTER — Encounter: Payer: Self-pay | Admitting: Orthopedic Surgery

## 2023-09-15 ENCOUNTER — Ambulatory Visit: Payer: Self-pay | Admitting: Physician Assistant

## 2023-09-15 VITALS — BP 153/82 | HR 59 | Temp 97.3°F | Resp 16 | Ht 72.0 in | Wt 265.0 lb

## 2023-09-15 DIAGNOSIS — B369 Superficial mycosis, unspecified: Secondary | ICD-10-CM

## 2023-09-15 DIAGNOSIS — R972 Elevated prostate specific antigen [PSA]: Secondary | ICD-10-CM

## 2023-09-15 DIAGNOSIS — Z0000 Encounter for general adult medical examination without abnormal findings: Secondary | ICD-10-CM

## 2023-09-15 LAB — POCT URINALYSIS DIPSTICK
Bilirubin, UA: NEGATIVE
Blood, UA: NEGATIVE
Glucose, UA: NEGATIVE
Ketones, UA: NEGATIVE
Leukocytes, UA: NEGATIVE
Nitrite, UA: NEGATIVE
Protein, UA: NEGATIVE
Spec Grav, UA: 1.01 (ref 1.010–1.025)
Urobilinogen, UA: 0.2 U/dL
pH, UA: 6.5 (ref 5.0–8.0)

## 2023-09-15 MED ORDER — NYSTATIN 100000 UNIT/GM EX CREA: 1.0000 | TOPICAL_CREAM | Freq: Two times a day (BID) | CUTANEOUS | 0 refills | Status: AC

## 2023-09-15 NOTE — Addendum Note (Signed)
Addended by: Gardner Candle on: 09/15/2023 10:18 AM   Modules accepted: Orders

## 2023-09-15 NOTE — Progress Notes (Signed)
[facility]  HISTORY  Chief Complaint No chief complaint on file.  HPI Bill Campbell is a 57 y.o. male patient presents for annual physical exam. Patient complains of bilateral helix and fossa itching x 1 month. Reports symptoms begin after showering in the evenings and resting in bed. Patient has not tried any agents for relief.       Past Medical History:  Diagnosis Date   Back pain, chronic    Colon polyp 06/2014   Elevated bilirubin    Elevated lipids    Family history of colon cancer    Gout    Overweight    Plantar fasciitis     Patient Active Problem List   Diagnosis Date Noted   Olecranon bursitis of right elbow 07/04/2021   History of gout 05/29/2019   Chronic low back pain without sciatica 05/29/2019   Low serum HDL 05/29/2019   Gout 07/20/2014   Class 2 obesity due to excess calories without serious comorbidity with body mass index (BMI) of 35.0 to 35.9 in adult 07/20/2014    Past Surgical History:  Procedure Laterality Date   APPENDECTOMY     KNEE SURGERY     TONSILLECTOMY AND ADENOIDECTOMY     VASECTOMY      Prior to Admission medications   Medication Sig Start Date End Date Taking? Authorizing Provider  acetaminophen (TYLENOL) 325 MG tablet Take 650 mg by mouth every 6 (six) hours as needed.   Yes [provider]  allopurinol (ZYLOPRIM) 300 MG tablet Take 1 tablet (300 mg total) by mouth daily. 02/10/23  Yes Joni Reining, PA-C  indomethacin (INDOCIN) 25 MG capsule Take 1 capsule (25 mg total) by mouth 3 (three) times daily as needed (Take 1-2 caps up to tid prn, take with food). Patient not taking: Reported on 09/15/2023 05/03/19   Jamelle Haring, MD  nystatin cream (MYCOSTATIN) Apply 1 Application topically 2 (two) times daily. 09/15/23   Joni Reining, PA-C  tadalafil (CIALIS) 20 MG tablet Take 0.5-1 tablets (10-20 mg total) by mouth every other day as needed for erectile dysfunction. Patient  not taking: Reported on 09/15/2023 08/19/22   Joni Reining, PA-C    Allergies Penicillins  Family History  Problem Relation Age of Onset   Hypertension Mother    Osteoporosis Mother    Heart attack Father    Colon cancer Paternal Grandfather     Social History Social History   Tobacco Use   Smoking status: Never   Smokeless tobacco: Current    Types: Chew  Substance Use Topics   Alcohol use: Yes    Alcohol/week: 6.0 standard drinks of alcohol    Types: 6 Cans of beer per week   Drug use: Never    Review of Systems Constitutional: No fever/chills Eyes: No visual changes. ENT: No sore throat. Discomfort at bilateral helix and fossa. Cardiovascular: Denies chest pain. Respiratory: Denies shortness of breath. Gastrointestinal: No abdominal pain.  No nausea, no vomiting.  No diarrhea.  No constipation. Genitourinary: Negative for dysuria. Musculoskeletal: Negative for back pain. Skin: Negative for rash. Neurological: Negative for headaches, focal weakness or numbness. ____________________________________________   PHYSICAL EXAM:  VITAL SIGNS: Today's Vitals   09/15/23 0915  BP: (!) 153/82  Pulse: (!) 59  Resp: 16  Temp: (!) 97.3 F (36.3 C)  SpO2: 98%  Weight: 265 lb (120.2 kg)  Height: 6' (1.829 m)   Body mass index is 35.94 kg/m.  Constitutional:  Alert and oriented. Well appearing and in no acute distress. Eyes: Conjunctivae are normal. PERRL. EOMI. Head: Atraumatic. Nose: No congestion/rhinnorhea. Mouth/Throat: Mucous membranes are moist.  Oropharynx non-erythematous. Neck: No stridor. No cervical spine tenderness to palpation. Hematological/Lymphatic/Immunilogical: No cervical lymphadenopathy. Cardiovascular: Normal rate, regular rhythm. Grossly normal heart sounds.  Good peripheral circulation. Respiratory: Normal respiratory effort.  No retractions. Lungs CTAB. Gastrointestinal: Soft and nontender. No distention. No abdominal bruits. No CVA  tenderness. Genitourinary: Deferred Musculoskeletal: No lower extremity tenderness nor edema.  No joint effusions. Neurologic:  Normal speech and language. No gross focal neurologic deficits are appreciated. No gait instability. Skin:  Skin is warm, dry and intact. Increased xerosis noted to bilateral helix and fossa.  Psychiatric: Mood and affect are normal. Speech and behavior are normal.  EKG Sinus Bradycardia at 54 bpm. Consistent with previous exams. Patient asymptomatic.   ASSESSMENT AND PLAN  Well Exam 2. Otomycosis -Prescribed fluconazole. Use of medication discussed with patient. Advised to follow up with this clinic if symptoms persist or worsen.  3. Elevated PSA -Referral to Urology for elevated PSA.  FOLLOW UP Advised to contact this clinic with needs or questions.

## 2023-09-15 NOTE — Progress Notes (Signed)
Here for annual physical with [facility].  No complaints, but stated his outer ears bother him sometimes and feel irritated.  UA obtained at this visit.

## 2023-09-22 DIAGNOSIS — M75121 Complete rotator cuff tear or rupture of right shoulder, not specified as traumatic: Secondary | ICD-10-CM | POA: Diagnosis not present

## 2023-09-24 ENCOUNTER — Ambulatory Visit: Payer: 59 | Admitting: General Practice

## 2023-09-24 ENCOUNTER — Other Ambulatory Visit: Payer: Self-pay

## 2023-09-24 ENCOUNTER — Encounter
Admission: RE | Disposition: A | Discharge status: Home / Self Care | Payer: Self-pay | Source: Home / Self Care | Attending: Orthopedic Surgery

## 2023-09-24 ENCOUNTER — Encounter: Payer: Self-pay | Admitting: Orthopedic Surgery

## 2023-09-24 ENCOUNTER — Ambulatory Visit
Admission: RE | Admit: 2023-09-24 | Discharge: 2023-09-24 | Disposition: A | Discharge status: Home / Self Care | Payer: 59 | Attending: Orthopedic Surgery | Admitting: Orthopedic Surgery

## 2023-09-24 DIAGNOSIS — M19011 Primary osteoarthritis, right shoulder: Secondary | ICD-10-CM | POA: Diagnosis not present

## 2023-09-24 DIAGNOSIS — M75121 Complete rotator cuff tear or rupture of right shoulder, not specified as traumatic: Secondary | ICD-10-CM | POA: Diagnosis not present

## 2023-09-24 DIAGNOSIS — M75101 Unspecified rotator cuff tear or rupture of right shoulder, not specified as traumatic: Secondary | ICD-10-CM | POA: Diagnosis not present

## 2023-09-24 DIAGNOSIS — F17220 Nicotine dependence, chewing tobacco, uncomplicated: Secondary | ICD-10-CM | POA: Insufficient documentation

## 2023-09-24 DIAGNOSIS — M25811 Other specified joint disorders, right shoulder: Secondary | ICD-10-CM | POA: Diagnosis not present

## 2023-09-24 DIAGNOSIS — M7541 Impingement syndrome of right shoulder: Secondary | ICD-10-CM | POA: Diagnosis not present

## 2023-09-24 DIAGNOSIS — M7521 Bicipital tendinitis, right shoulder: Secondary | ICD-10-CM | POA: Diagnosis not present

## 2023-09-24 DIAGNOSIS — M7581 Other shoulder lesions, right shoulder: Secondary | ICD-10-CM | POA: Diagnosis not present

## 2023-09-24 DIAGNOSIS — G8918 Other acute postprocedural pain: Secondary | ICD-10-CM | POA: Diagnosis not present

## 2023-09-24 HISTORY — PX: SHOULDER ARTHROSCOPY WITH SUBACROMIAL DECOMPRESSION AND OPEN ROTATOR C: SHX5688

## 2023-09-24 HISTORY — DX: Other specified health status: Z78.9

## 2023-09-24 SURGERY — SHOULDER ARTHROSCOPY WITH SUBACROMIAL DECOMPRESSION AND OPEN ROTATOR CUFF REPAIR, OPEN BICEPS TENDON REPAIR
Anesthesia: General | Laterality: Right

## 2023-09-24 MED ORDER — EPHEDRINE SULFATE (PRESSORS) 50 MG/ML IJ SOLN
INTRAMUSCULAR | Status: DC | PRN
  Administered 2023-09-24 (×2): 10 mg via INTRAVENOUS
  Administered 2023-09-24: 5 mg via INTRAVENOUS
  Administered 2023-09-24: 10 mg via INTRAVENOUS

## 2023-09-24 MED ORDER — FENTANYL CITRATE (PF) 100 MCG/2ML IJ SOLN
INTRAMUSCULAR | Status: AC
  Filled 2023-09-24: qty 2

## 2023-09-24 MED ORDER — DEXAMETHASONE SODIUM PHOSPHATE 4 MG/ML IJ SOLN
INTRAMUSCULAR | Status: AC
  Filled 2023-09-24: qty 1

## 2023-09-24 MED ORDER — LACTATED RINGERS IV SOLN
INTRAVENOUS | Status: DC | PRN
  Administered 2023-09-24: 4 mL

## 2023-09-24 MED ORDER — MIDAZOLAM HCL 2 MG/2ML IJ SOLN
INTRAMUSCULAR | Status: AC
  Filled 2023-09-24: qty 2

## 2023-09-24 MED ORDER — EPHEDRINE 5 MG/ML INJ
INTRAVENOUS | Status: AC
  Filled 2023-09-24: qty 5

## 2023-09-24 MED ORDER — CEFAZOLIN SODIUM-DEXTROSE 2-3 GM-%(50ML) IV SOLR
INTRAVENOUS | Status: AC
  Filled 2023-09-24: qty 50

## 2023-09-24 MED ORDER — DEXAMETHASONE SODIUM PHOSPHATE 10 MG/ML IJ SOLN
INTRAMUSCULAR | Status: DC | PRN
Start: 2023-09-24 — End: 2023-09-24
  Administered 2023-09-24: 4 mg
  Administered 2023-09-24: 10 mg

## 2023-09-24 MED ORDER — LIDOCAINE HCL (PF) 2 % IJ SOLN
INTRAMUSCULAR | Status: AC
Start: 2023-09-24 — End: ?
  Filled 2023-09-24: qty 5

## 2023-09-24 MED ORDER — GLYCOPYRROLATE 0.2 MG/ML IJ SOLN
INTRAMUSCULAR | Status: DC | PRN
  Administered 2023-09-24 (×2): .1 mg via INTRAVENOUS

## 2023-09-24 MED ORDER — OXYCODONE HCL 5 MG PO TABS: 5.0000 mg | ORAL_TABLET | ORAL | 0 refills | Status: DC | PRN

## 2023-09-24 MED ORDER — DEXAMETHASONE SODIUM PHOSPHATE 10 MG/ML IJ SOLN
INTRAMUSCULAR | Status: AC
  Filled 2023-09-24: qty 1

## 2023-09-24 MED ORDER — MIDAZOLAM HCL 5 MG/5ML IJ SOLN
INTRAMUSCULAR | Status: DC | PRN
  Administered 2023-09-24 (×2): 1 mg via INTRAVENOUS

## 2023-09-24 MED ORDER — ONDANSETRON HCL 4 MG/2ML IJ SOLN
INTRAMUSCULAR | Status: DC | PRN
  Administered 2023-09-24: 4 mg via INTRAVENOUS

## 2023-09-24 MED ORDER — GLYCOPYRROLATE 0.2 MG/ML IJ SOLN
INTRAMUSCULAR | Status: AC
  Filled 2023-09-24: qty 1

## 2023-09-24 MED ORDER — ONDANSETRON HCL 4 MG/2ML IJ SOLN
INTRAMUSCULAR | Status: AC
  Filled 2023-09-24: qty 2

## 2023-09-24 MED ORDER — BUPIVACAINE LIPOSOME 1.3 % IJ SUSP
INTRAMUSCULAR | Status: AC
  Filled 2023-09-24: qty 10

## 2023-09-24 MED ORDER — ASPIRIN 325 MG PO TBEC: 325.0000 mg | DELAYED_RELEASE_TABLET | Freq: Every day | ORAL | 0 refills | Status: AC

## 2023-09-24 MED ORDER — SEVOFLURANE IN SOLN
RESPIRATORY_TRACT | Status: AC
  Filled 2023-09-24: qty 250

## 2023-09-24 MED ORDER — SEVOFLURANE IN SOLN
RESPIRATORY_TRACT | Status: AC
Start: 2023-09-24 — End: ?
  Filled 2023-09-24: qty 250

## 2023-09-24 MED ORDER — SUCCINYLCHOLINE CHLORIDE 200 MG/10ML IV SOSY
PREFILLED_SYRINGE | INTRAVENOUS | Status: AC
  Filled 2023-09-24: qty 10

## 2023-09-24 MED ORDER — SUCCINYLCHOLINE CHLORIDE 200 MG/10ML IV SOSY
PREFILLED_SYRINGE | INTRAVENOUS | Status: DC | PRN
  Administered 2023-09-24: 140 mg via INTRAVENOUS

## 2023-09-24 MED ORDER — BUPIVACAINE HCL (PF) 0.25 % IJ SOLN
INTRAMUSCULAR | Status: DC | PRN
Start: 2023-09-24 — End: 2023-09-24
  Administered 2023-09-24: 20 mL via PERINEURAL

## 2023-09-24 MED ORDER — ALBUTEROL SULFATE HFA 108 (90 BASE) MCG/ACT IN AERS
INHALATION_SPRAY | RESPIRATORY_TRACT | Status: AC
  Filled 2023-09-24: qty 6.7

## 2023-09-24 MED ORDER — CEFAZOLIN SODIUM-DEXTROSE 2-4 GM/100ML-% IV SOLN
2.0000 g | INTRAVENOUS | Status: AC
  Administered 2023-09-24: 2 g via INTRAVENOUS

## 2023-09-24 MED ORDER — LIDOCAINE HCL (PF) 1 % IJ SOLN
INTRAMUSCULAR | Status: DC | PRN
Start: 2023-09-24 — End: 2023-09-24
  Administered 2023-09-24: 1 mL via SUBCUTANEOUS

## 2023-09-24 MED ORDER — PROPOFOL 10 MG/ML IV BOLUS
INTRAVENOUS | Status: AC
Start: 2023-09-24 — End: ?
  Filled 2023-09-24: qty 20

## 2023-09-24 MED ORDER — ACETAMINOPHEN 500 MG PO TABS: 1000.0000 mg | ORAL_TABLET | Freq: Three times a day (TID) | ORAL | 2 refills | Status: DC

## 2023-09-24 MED ORDER — MIDAZOLAM HCL 2 MG/2ML IJ SOLN
2.0000 mg | Freq: Once | INTRAMUSCULAR | Status: AC
  Administered 2023-09-24: 2 mg via INTRAVENOUS

## 2023-09-24 MED ORDER — BUPIVACAINE HCL 0.25 % IJ SOLN
INTRAMUSCULAR | Status: AC
  Filled 2023-09-24: qty 1

## 2023-09-24 MED ORDER — BUPIVACAINE LIPOSOME 1.3 % IJ SUSP
INTRAMUSCULAR | Status: DC | PRN
Start: 2023-09-24 — End: 2023-09-24
  Administered 2023-09-24: 10 mL via PERINEURAL

## 2023-09-24 MED ORDER — LIDOCAINE HCL (PF) 2 % IJ SOLN
INTRAMUSCULAR | Status: AC
  Filled 2023-09-24: qty 2

## 2023-09-24 MED ORDER — FENTANYL CITRATE (PF) 100 MCG/2ML IJ SOLN
100.0000 ug | Freq: Once | INTRAMUSCULAR | Status: AC
  Administered 2023-09-24: 100 ug via INTRAVENOUS

## 2023-09-24 MED ORDER — PROPOFOL 10 MG/ML IV BOLUS
INTRAVENOUS | Status: DC | PRN
  Administered 2023-09-24: 200 mg via INTRAVENOUS

## 2023-09-24 MED ORDER — LIDOCAINE HCL (CARDIAC) PF 100 MG/5ML IV SOSY
PREFILLED_SYRINGE | INTRAVENOUS | Status: DC | PRN
  Administered 2023-09-24: 60 mg via INTRATRACHEAL

## 2023-09-24 MED ORDER — FENTANYL CITRATE (PF) 100 MCG/2ML IJ SOLN
INTRAMUSCULAR | Status: DC | PRN
  Administered 2023-09-24: 100 ug via INTRAVENOUS

## 2023-09-24 MED ORDER — LACTATED RINGERS IR SOLN: Status: DC | PRN

## 2023-09-24 MED ORDER — ONDANSETRON 4 MG PO TBDP: 4.0000 mg | ORAL_TABLET | Freq: Three times a day (TID) | ORAL | 0 refills | Status: DC | PRN

## 2023-09-24 SURGICAL SUPPLY — 63 items
ADH SKN CLS APL DERMABOND .7 (GAUZE/BANDAGES/DRESSINGS) ×1
ADPR IRR PORT MULTIBAG TUBE (MISCELLANEOUS) ×2
ANCH SUT 2 SWLK 19.1 CLS EYLT (Anchor) ×3 IMPLANT
ANCH SUT 2.9 PUSHLOCK ANCH (Orthopedic Implant) ×1 IMPLANT
ANCH SUT 2X2.3 TAPE (Anchor) ×2 IMPLANT
ANCHOR 2.3 SP SGL 1.2 XBRAID (Anchor) IMPLANT
ANCHOR SWIVELOCK BIO 4.75X19.1 (Anchor) IMPLANT
APL PRP STRL LF DISP 70% ISPRP (MISCELLANEOUS) ×1
BLADE SHAVER 4.5X7 STR FR (MISCELLANEOUS) ×1 IMPLANT
BUR BR 5.5 WIDE MOUTH (BURR) ×1 IMPLANT
CANNULA PART THRD DISP 5.75X7 (CANNULA) ×1 IMPLANT
CANNULA PARTIAL THREAD 2X7 (CANNULA) ×1 IMPLANT
CANNULA TWIST IN 8.25X7CM (CANNULA) IMPLANT
CHLORAPREP W/TINT 26 (MISCELLANEOUS) ×1 IMPLANT
COOLER POLAR GLACIER W/PUMP (MISCELLANEOUS) ×1 IMPLANT
COVER LIGHT HANDLE UNIVERSAL (MISCELLANEOUS) ×2 IMPLANT
DERMABOND ADVANCED .7 DNX12 (GAUZE/BANDAGES/DRESSINGS) ×1 IMPLANT
DRAPE INCISE IOBAN 66X45 STRL (DRAPES) ×1 IMPLANT
DRAPE U-SHAPE 48X52 POLY STRL (PACKS) ×1 IMPLANT
DRSG TEGADERM 4X4.75 (GAUZE/BANDAGES/DRESSINGS) ×3 IMPLANT
ELECT REM PT RETURN 9FT ADLT (ELECTROSURGICAL) ×1 IMPLANT
ELECTRODE REM PT RTRN 9FT ADLT (ELECTROSURGICAL) ×1 IMPLANT
GAUZE SPONGE 4X4 12PLY STRL (GAUZE/BANDAGES/DRESSINGS) ×1 IMPLANT
GAUZE XEROFORM 1X8 LF (GAUZE/BANDAGES/DRESSINGS) ×1 IMPLANT
GLOVE SRG 8 PF TXTR STRL LF DI (GLOVE) ×3 IMPLANT
GLOVE SURG ENC MOIS LTX SZ7.5 (GLOVE) ×5 IMPLANT
GLOVE SURG UNDER POLY LF SZ8 (GLOVE) ×3
GOWN STRL REIN 2XL XLG LVL4 (GOWN DISPOSABLE) ×1 IMPLANT
GOWN STRL REUS W/ TWL LRG LVL3 (GOWN DISPOSABLE) ×3 IMPLANT
GOWN STRL REUS W/TWL LRG LVL3 (GOWN DISPOSABLE) ×3
IV LR IRRIG 3000ML ARTHROMATIC (IV SOLUTION) ×6 IMPLANT
KIT STABILIZATION SHOULDER (MISCELLANEOUS) ×1 IMPLANT
KIT TURNOVER KIT A (KITS) ×1 IMPLANT
MANIFOLD 4PT FOR NEPTUNE1 (MISCELLANEOUS) ×1 IMPLANT
MASK FACE SPIDER DISP (MASK) ×1 IMPLANT
MAT ABSORB FLUID 56X50 GRAY (MISCELLANEOUS) ×2 IMPLANT
PACK ARTHROSCOPY SHOULDER (MISCELLANEOUS) ×1 IMPLANT
PAD ABD DERMACEA PRESS 5X9 (GAUZE/BANDAGES/DRESSINGS) ×2 IMPLANT
PAD WRAPON POLAR SHDR XLG (MISCELLANEOUS) ×1 IMPLANT
PASSER SUT FIRSTPASS SELF (INSTRUMENTS) IMPLANT
SET Y ADAPTER MULIT-BAG IRRIG (MISCELLANEOUS) ×2 IMPLANT
SUT ETHILON 3-0 (SUTURE) ×1 IMPLANT
SUT ETHILON 3-0 FS-10 30 BLK (SUTURE) ×2 IMPLANT
SUT FIBERWIRE #2 38 T-5 BLUE (SUTURE) ×1 IMPLANT
SUT LASSO 90 DEG CVD (SUTURE) IMPLANT
SUT MNCRL 4-0 (SUTURE) ×1
SUT MNCRL 4-0 27 PS-2 XMFL (SUTURE) ×1 IMPLANT
SUT MNCRL 4-0 27XMFL (SUTURE) ×1
SUT PROLENE 0 CT 2 (SUTURE) ×1 IMPLANT
SUT VIC AB 0 CT1 36 (SUTURE) ×1 IMPLANT
SUT VIC AB 2-0 CT2 27 (SUTURE) ×1 IMPLANT
SUTURE EHLN 3-0 FS-10 30 BLK (SUTURE) IMPLANT
SUTURE FIBERWR #2 38 T-5 BLUE (SUTURE) IMPLANT
SUTURE MNCRL 4-0 27XMF (SUTURE) ×1 IMPLANT
SUTURETAPE 1.3 40 W/NDL BLK/WH (SUTURE) IMPLANT
SUTURETAPE 1.3 40 W/NDL BLUE (SUTURE) IMPLANT
SYSTEM IMPL TENODESIS LNT 2.9 (Orthopedic Implant) IMPLANT
TAPE MICROFOAM 4IN (TAPE) ×1 IMPLANT
TUBING CONNECTING 10 (TUBING) ×1 IMPLANT
TUBING INFLOW SET DBFLO PUMP (TUBING) ×1 IMPLANT
TUBING OUTFLOW SET DBLFO PUMP (TUBING) ×1 IMPLANT
WAND WEREWOLF FLOW 90D (MISCELLANEOUS) ×1 IMPLANT
WRAPON POLAR PAD SHDR XLG (MISCELLANEOUS) ×1 IMPLANT

## 2023-09-24 NOTE — Discharge Instructions (Signed)

## 2023-09-24 NOTE — H&P (Signed)
Paper H&P to be scanned into permanent record. H&P reviewed. No significant changes noted.  

## 2023-09-24 NOTE — Anesthesia Procedure Notes (Signed)
Anesthesia Regional Block: Cervical plexus block   Pre-Anesthetic Checklist: , timeout performed,  Correct Patient, Correct Site, Correct Laterality,  Correct Procedure, Correct Position, site marked,  Risks and benefits discussed,  Surgical consent,  Pre-op evaluation,  At surgeon's request and post-op pain management  Laterality: Right and Upper  Prep: chloraprep       Needles:  Injection technique: Single-shot      Needle Length: 2cm  Needle Gauge: 24     Additional Needles:   Narrative:  Start time: 09/24/2023 8:41 AM End time: 09/24/2023 8:51 AM  Performed by: Personally  Anesthesiologist: Marisue Humble, MD  Additional Notes: Superficial cervical plexus block for analgesia for distal clavicle surgery today

## 2023-09-24 NOTE — Anesthesia Procedure Notes (Addendum)
Anesthesia Regional Block: Interscalene brachial plexus block   Pre-Anesthetic Checklist: , timeout performed,  Correct Patient, Correct Site, Correct Laterality,  Correct Procedure, Correct Position, site marked,  Risks and benefits discussed,  Surgical consent,  Pre-op evaluation,  At surgeon's request and post-op pain management  Laterality: Right and Upper  Prep: chloraprep       Needles:  Injection technique: Single-shot  Needle Type: Stimiplex     Needle Length: 10cm  Needle Gauge: 21     Additional Needles:   Procedures:,,,, ultrasound used (permanent image in chart),,    Narrative:  Start time: 09/24/2023 8:41 AM End time: 09/24/2023 8:51 AM Injection made incrementally with aspirations every 5 mL.  Performed by: Personally   Additional Notes: Functioning IV was confirmed and monitors applied. Ultrasound guidance: relevant anatomy identified, needle position confirmed, local anesthetic spread visualized around nerve(s)., vascular puncture avoided.  Image printed for medical record.  Negative aspiration and no paresthesias; incremental administration of local anesthetic. The patient tolerated the procedure well. Vitals signs recorded in RN notes.

## 2023-09-24 NOTE — Anesthesia Postprocedure Evaluation (Signed)
Anesthesia Post Note  Patient: Bill Campbell  Procedure(s) Performed: Right shoulder arthroscopic rotator cuff repair (subscapularis and supraspinatus), subacromial decompression, distal clavicle excision, and biceps tenodesis (Right)  Patient location during evaluation: PACU Anesthesia Type: General Level of consciousness: awake and alert Pain management: pain level controlled Vital Signs Assessment: post-procedure vital signs reviewed and stable Respiratory status: spontaneous breathing, nonlabored ventilation, respiratory function stable and patient connected to nasal cannula oxygen Cardiovascular status: blood pressure returned to baseline and stable Postop Assessment: no apparent nausea or vomiting Anesthetic complications: no   No notable events documented.   Last Vitals:  Vitals:   09/24/23 1250 09/24/23 1255  BP:    Pulse: 70 65  Resp: 17 18  Temp:    SpO2: 94% 97%    Last Pain:  Vitals:   09/24/23 1245  TempSrc:   PainSc: 0-No pain                 Sherlyne Crownover C Demarques Pilz

## 2023-09-24 NOTE — Anesthesia Procedure Notes (Signed)
Procedure Name: Intubation Date/Time: 09/24/2023 9:53 AM  Performed by: Barbette Hair, CRNAPre-anesthesia Checklist: Patient identified, Emergency Drugs available, Suction available, Patient being monitored and Timeout performed Patient Re-evaluated:Patient Re-evaluated prior to induction Oxygen Delivery Method: Circle system utilized Preoxygenation: Pre-oxygenation with 100% oxygen Induction Type: IV induction Ventilation: Mask ventilation without difficulty Laryngoscope Size: Mac and 4 Grade View: Grade I Tube type: Oral Tube size: 7.5 mm Number of attempts: 1 Airway Equipment and Method: Stylet Placement Confirmation: ETT inserted through vocal cords under direct vision, positive ETCO2, CO2 detector and breath sounds checked- equal and bilateral Secured at: 23 cm Tube secured with: Tape Dental Injury: Teeth and Oropharynx as per pre-operative assessment

## 2023-09-24 NOTE — Transfer of Care (Signed)
Immediate Anesthesia Transfer of Care Note  Patient: Bill Campbell  Procedure(s) Performed: Right shoulder arthroscopic rotator cuff repair (subscapularis and supraspinatus), subacromial decompression, distal clavicle excision, and biceps tenodesis (Right)  Patient Location: PACU  Anesthesia Type: General ETT  Level of Consciousness: awake, alert  and patient cooperative  Airway and Oxygen Therapy: Patient Spontanous Breathing and Patient connected to supplemental oxygen  Post-op Assessment: Post-op Vital signs reviewed, Patient's Cardiovascular Status Stable, Respiratory Function Stable, Patent Airway and No signs of Nausea or vomiting  Post-op Vital Signs: Reviewed and stable  Complications: No notable events documented.

## 2023-09-24 NOTE — Anesthesia Preprocedure Evaluation (Addendum)
Anesthesia Evaluation  Patient identified by MRN, date of birth, ID band Patient awake    Reviewed: Allergy & Precautions, H&P , NPO status , Patient's Chart, lab work & pertinent test results  Airway Mallampati: III  TM Distance: >3 FB Neck ROM: Full    Dental no notable dental hx.  Implant right upper central incisor:   Pulmonary neg pulmonary ROS   Pulmonary exam normal breath sounds clear to auscultation       Cardiovascular negative cardio ROS Normal cardiovascular exam Rhythm:Regular Rate:Normal     Neuro/Psych negative neurological ROS  negative psych ROS   GI/Hepatic negative GI ROS, Neg liver ROS,,,  Endo/Other  negative endocrine ROS    Renal/GU negative Renal ROS  negative genitourinary   Musculoskeletal negative musculoskeletal ROS (+)    Abdominal   Peds negative pediatric ROS (+)  Hematology negative hematology ROS (+)   Anesthesia Other Findings Gout Overweight Chronic back pain Snores, daytime drowsiness, wife complains about his snoring, counseled to have sleep study, risks of untreated sleep apnea, patient states he will seek sleep study  Reproductive/Obstetrics negative OB ROS                             Anesthesia Physical Anesthesia Plan  ASA: 2  Anesthesia Plan: General ETT   Post-op Pain Management:    Induction: Intravenous  PONV Risk Score and Plan:   Airway Management Planned: Oral ETT  Additional Equipment:   Intra-op Plan:   Post-operative Plan: Extubation in OR  Informed Consent: I have reviewed the patients History and Physical, chart, labs and discussed the procedure including the risks, benefits and alternatives for the proposed anesthesia with the patient or authorized representative who has indicated his/her understanding and acceptance.     Dental Advisory Given  Plan Discussed with: Anesthesiologist, CRNA and Surgeon  Anesthesia  Plan Comments: (Patient consented for risks of anesthesia including but not limited to:  - adverse reactions to medications - damage to eyes, teeth, lips or other oral mucosa - nerve damage due to positioning  - sore throat or hoarseness - Damage to heart, brain, nerves, lungs, other parts of body or loss of life  Patient voiced understanding and assent.)       Anesthesia Quick Evaluation

## 2023-09-24 NOTE — Op Note (Signed)
SURGERY DATE: 09/24/2023   PRE-OP DIAGNOSIS:  1. Right subacromial impingement 2. Right biceps tendinopathy 3. Right rotator cuff tear 4. Right acromioclavicular joint arthritis  POST-OP DIAGNOSIS: 1. Right subacromial impingement 2. Right biceps tendinopathy 3. Right rotator cuff tear 4. Right acromioclavicular joint arthritis  PROCEDURES:  1. Right arthroscopic rotator cuff repair (full-thickness subscapularis and supraspinatus) 2. Right arthroscopic biceps tenodesis 3. Right arthroscopic subacromial decompression 4. Right arthroscopic extensive debridement of shoulder (glenohumeral and subacromial spaces) 5. Right arthroscopic distal clavicle excision   SURGEON: Rosealee Albee, MD   ASSISTANT: Sonny Dandy, PA; Ahmed Prima, PA-S    ANESTHESIA: Gen with Exparel interscalene block   ESTIMATED BLOOD LOSS: 5cc   DRAINS:  none   TOTAL IV FLUIDS: per anesthesia      SPECIMENS: none   IMPLANTS:  - Arthrex 2.87mm PushLock x 1 - Arthrex 4.69mm SwiveLock x 3 - Iconix SPEED double loaded with 1.2 and 2.43mm tape x 1     OPERATIVE FINDINGS:  Examination under anesthesia: A careful examination under anesthesia was performed.  Passive range of motion was: FF: 150; ER at side: 45; ER in abduction: 90; IR in abduction: 45.  Anterior load shift: NT.  Posterior load shift: NT.  Sulcus in neutral: NT.  Sulcus in ER: NT.     Intra-operative findings: A thorough arthroscopic examination of the shoulder was performed.  The findings are: 1. Biceps tendon: severe split thickness tearing and tendinopathy with significant erythema  2. Superior labrum: erythema and degenerative fraying 3. Posterior labrum and capsule: degenerative fraying 4. Inferior capsule and inferior recess: normal 5. Glenoid cartilage surface: Normal 6. Supraspinatus attachment: full-thickness tear of the supraspinatus 7. Posterior rotator cuff attachment: normal 8. Humeral head articular cartilage: normal 9. Rotator  interval: significant synovitis 10: Subscapularis tendon: Full-thickness tear of entire subscapularis 11. Anterior labrum: significant degenerative fraying 12. IGHL: normal   OPERATIVE REPORT:    Indications for procedure:  Bill Campbell is a 57 y.o. male with approximately 1 year of right shoulder pain with sensations of weakness. Clinical exam and MRI were suggestive of rotator cuff tear, biceps tendinopathy, acromioclavicular joint arthritis and subacromial impingement. After discussion of risks, benefits, and alternatives to surgery, the patient elected to proceed.    Procedure in detail:   I identified Bill Campbell in the pre-operative holding area.  I marked the operative shoulder with my initials. I reviewed the risks and benefits of the proposed surgical intervention, and the patient wished to proceed.  Anesthesia was then performed with an Exparel interscalene block.  The patient was transferred to the operative suite and placed in the beach chair position.     Appropriate IV antibiotics were administered prior to incision. The operative upper extremity was then prepped and draped in standard fashion. A time out was performed confirming the correct extremity, correct patient, and correct procedure.    I then created a standard posterior portal with an 11 blade. The glenohumeral joint was easily entered with a blunt trocar and the arthroscope introduced. The findings of diagnostic arthroscopy are described above. I debrided degenerative tissue including the synovitic tissue about the rotator interval and anterior, posterior, and superior labrum. I then coagulated the inflamed synovium to obtain hemostasis and reduce the risk of post-operative swelling using an Arthrocare radiofrequency device.   I then turned my attention to the arthroscopic biceps tenodesis. The Loop n Tack technique was used to pass a FiberTape through the biceps in a locked fashion  adjacent to the biceps anchor.   A hole for a 2.9 mm Arthrex PushLock was drilled in the bicipital groove just superior to the subscapularis tendon insertion.  The biceps tendon was then cut and the biceps anchor complex was debrided down to a stable base on the superior labrum.  The FiberTape was loaded onto the PushLock anchor and impacted into place into the previously drilled hole in the bicipital groove.  This appropriately secured the biceps into the bicipital groove and took it off of tension.  The subscapularis tear was identified.  A superior anterolateral portal was made under needle localization.  A cannula was placed.  The, comma tissue indicating the superolateral border of the subscapularis was identified readily as there was not severe retraction.  The tip of the coracoid as well as the conjoined tendon and coracoacromial ligaments were visualized after debriding rotator interval tissue.  Tissue about the subscapularis was released anteriorly, superiorly, and posteriorly to allow for improved mobilization.  The comma tissue was tagged with a FiberWire suture and tension on this allowed for appropriate mobilization of the subscapularis.  The lesser tuberosity footprint was prepared with a combination of electrocautery and burr. 2 suture tapes were passed in a mattress fashion.  The traction stitch was removed.  The inferior strand of each suture was then loaded onto a 4.75 mm SwiveLock anchor and placed into the inferior portion of the prepared footprint with the arm in a neutral position. This was was repeated for the superior sutures into a more superior SwiveLock anchor. Knotless mechanism from superior anchor was used to further reduce the upper border. This construct appropriately reduced the subscapularis tear.  The arm was then internally and externally rotated and the subscapularis was noted to move appropriately with rotation.  The remainder of the suture was then cut.   Next, the arthroscope was then introduced into the  subacromial space. A direct lateral portal was created with an 11-blade after spinal needle localization. An extensive subacromial bursectomy and debridement was performed using a combination of the shaver and Arthrocare wand. The entire acromial undersurface was exposed and the CA ligament was subperiosteally elevated to expose the anterior acromial hook. A burr was used to create a flat anterior and lateral aspect of the acromion, converting it from a Type 2 to a Type 1 acromion. Care was made to keep the deltoid fascia intact.  I then turned my attention to the arthroscopic distal clavicle excision. I identified the acromioclavicular joint. Surrounding bursal tissue was debrided and the edges of the joint were identified. I used the 5.14mm barrel burr to remove the distal clavicle parallel to the edge of the acromion. I was able to fit two widths of the burr into the space between the distal clavicle and acromion, signifying that I had removed ~23mm of distal clavicle. This was confirmed by viewing anteriorly and introducing a probe with measuring marks from the lateral portal. Hemostasis was achieved with an Arthrocare wand.    Next, I created an accessory posterolateral portal to assist with visualization and instrumentation.  I debrided the poor quality edges of the supraspinatus tendon.  This was a U-shaped tear of the supraspinatus.  I prepared the footprint using a burr to expose bleeding bone.    I then percutaneously placed 2 Iconix SPEED medial row anchors at the articular margin, one anteriorly, and one posteriorly. I removed one suture from each anchor as they were not necessary given the size of the tear. I then  shuttled all 4 remaining strands of tape through the rotator cuff just lateral to the musculotendinous junction using a FirstPass suture passer spanning the anterior to posterior extent of the tear. All 4 strands of suture were passed through an [employer] anchor.  This was placed  approximately 2 cm distal to the lateral edge of the footprint in line with the midportion of the tear with appropriate tensioning of each suture prior to final fixation. There was 1 small dogear one anteriorly.  The knotless mechanism of the SwiveLock anchor was utilized to reduce the dogear. This construct allowed for excellent reapproximation of the rotator cuff to its native footprint without undue tension.  Appropriate compression was achieved. The repair was stable to external and internal rotation.   Fluid was evacuated from the shoulder, and the portals were closed with 3-0 Nylon. Xeroform was applied to the portals. A sterile dressing was applied, followed by a Polar Care sleeve and a SlingShot shoulder immobilizer/sling. The patient was awakened from anesthesia without difficulty and was transferred to the PACU in stable condition.    Of note, assistance from a PA was essential to performing the surgery.  PA was present for the entire surgery.  PA assisted with patient positioning, retraction, instrumentation, and wound closure. The surgery would have been more difficult and had longer operative time without PA assistance.   COMPLICATIONS: none   DISPOSITION: plan for discharge home after recovery in PACU     POSTOPERATIVE PLAN: Remain in sling (except hygiene and elbow/wrist/hand RoM exercises as instructed by PT) x 6 weeks and NWB for this time. PT to begin 3-4 days after surgery.  Large rotator cuff repair rehab protocol with subscapularis restrictions. ASA 325mg  daily x 2 weeks for DVT ppx.

## 2023-09-27 ENCOUNTER — Encounter: Payer: Self-pay | Admitting: Orthopedic Surgery

## 2023-09-27 DIAGNOSIS — Z9889 Other specified postprocedural states: Secondary | ICD-10-CM | POA: Diagnosis not present

## 2023-10-06 ENCOUNTER — Ambulatory Visit: Payer: 59 | Admitting: Urology

## 2023-10-06 ENCOUNTER — Other Ambulatory Visit
Admission: RE | Admit: 2023-10-06 | Discharge: 2023-10-06 | Disposition: A | Discharge status: Home / Self Care | Payer: 59 | Attending: Urology | Admitting: Urology

## 2023-10-06 ENCOUNTER — Encounter: Payer: Self-pay | Admitting: Urology

## 2023-10-06 VITALS — BP 156/91 | HR 61 | Ht 72.0 in | Wt 264.0 lb

## 2023-10-06 DIAGNOSIS — R972 Elevated prostate specific antigen [PSA]: Secondary | ICD-10-CM

## 2023-10-06 NOTE — Patient Instructions (Signed)
 Prostate Cancer Screening  Prostate cancer screening is testing that is done to check for the presence of prostate cancer in men. The prostate gland is a walnut-sized gland that is located below the bladder and in front of the rectum in males. The function of the prostate is to add fluid to semen during ejaculation. Prostate cancer is one of the most common types of cancer in men. Who should have prostate cancer screening? Screening recommendations vary based on age and other risk factors, as well as between the professional organizations who make the recommendations. In general, screening is recommended if: You are age 65 to 36 and have an average risk for prostate cancer. You should talk with your health care provider about your need for screening and how often screening should be done. Because most prostate cancers are slow growing and will not cause death, screening in this age group is generally reserved for men who have a 10- to 15-year life expectancy. You are younger than age 74, and you have these risk factors: Having a father, brother, or uncle who has been diagnosed with prostate cancer. The risk is higher if your family member's cancer occurred at an early age or if you have multiple family members with prostate cancer at an early age. Being a male who is [place] or is of [place] or sub-Saharan African descent. In general, screening is not recommended if: You are younger than age 60. You are between the ages of 37 and 43 and you have no risk factors. You are 74 years of age or older. At this age, the risks that screening can cause are greater than the benefits that it may provide. If you are at high risk for prostate cancer, your health care provider may recommend that you have screenings more often or that you start screening at a younger age. How is screening for prostate cancer done? The recommended prostate cancer screening test is a blood test called the prostate-specific antigen  (PSA) test. PSA is a protein that is made in the prostate. As you age, your prostate naturally produces more PSA. Abnormally high PSA levels may be caused by: Prostate cancer. An enlarged prostate that is not caused by cancer (benign prostatic hyperplasia, or BPH). This condition is very common in older men. A prostate gland infection (prostatitis) or urinary tract infection. Certain medicines such as male hormones (like testosterone) or other medicines that raise testosterone levels. A rectal exam may be done as part of prostate cancer screening to help provide information about the size of your prostate gland. When a rectal exam is performed, it should be done after the PSA level is drawn to avoid any effect on the results. Depending on the PSA results, you may need more tests, such as: A physical exam to check the size of your prostate gland, if not done as part of screening. Blood and imaging tests. A procedure to remove tissue samples from your prostate gland for testing (biopsy). This is the only way to know for certain if you have prostate cancer. What are the benefits of prostate cancer screening? Screening can help to identify cancer at an early stage, before symptoms start and when the cancer can be treated more easily. There is a small chance that screening may lower your risk of dying from prostate cancer. The chance is small because prostate cancer is a slow-growing cancer, and most men with prostate cancer die from a different cause. What are the risks of prostate cancer screening? The  main risk of prostate cancer screening is diagnosing and treating prostate cancer that would never have caused any symptoms or problems. This is called overdiagnosisand overtreatment. PSA screening cannot tell you if your PSA is high due to cancer or a different cause. A prostate biopsy is the only procedure to diagnose prostate cancer. Even the results of a biopsy may not tell you if your cancer needs to  be treated. Slow-growing prostate cancer may not need any treatment other than monitoring, so diagnosing and treating it may cause unnecessary stress or other side effects. Questions to ask your health care provider When should I start prostate cancer screening? What is my risk for prostate cancer? How often do I need screening? What type of screening tests do I need? How do I get my test results? What do my results mean? Do I need treatment? Where to find more information The American Cancer Society: www.cancer.org American Urological Association: www.auanet.org Contact a health care provider if: You have difficulty urinating. You have pain when you urinate or ejaculate. You have blood in your urine or semen. You have pain in your back or in the area of your prostate. Summary Prostate cancer is a common type of cancer in men. The prostate gland is located below the bladder and in front of the rectum. This gland adds fluid to semen during ejaculation. Prostate cancer screening may identify cancer at an early stage, when the cancer can be treated more easily and is less likely to have spread to other areas of the body. The prostate-specific antigen (PSA) test is the recommended screening test for prostate cancer, but it has associated risks. Discuss the risks and benefits of prostate cancer screening with your health care provider. If you are age 66 or older, the risks that screening can cause are greater than the benefits that it may provide. This information is not intended to replace advice given to you by your health care provider. Make sure you discuss any questions you have with your health care provider. Document Revised: 04/28/2021 Document Reviewed: 04/28/2021 Elsevier Patient Education  2024 Elsevier Inc.  Transrectal Ultrasound-Guided Prostate Biopsy A transrectal ultrasound-guided prostate biopsy is a procedure to remove samples of prostate tissue for testing. The prostate is a  walnut-sized gland that is located below the bladder and in front of the rectum. During this procedure, a small device (probe) is lubricated and put inside the rectum. The probe sends out sound waves that make a picture of the prostate and surrounding tissues (transrectal ultrasound). The images are used to help guide the process of removing the samples. The samples are taken to a lab to be checked for prostate cancer. This procedure is usually done to evaluate the prostate gland of men who have raised (elevated) levels of prostate-specific antigen (PSA), which can be a sign of prostate cancer or prostate enlargement related to aging (benign prostatic hyperplasia, or BPH). Tell a health care provider about: Any allergies you have. All medicines you are taking, including vitamins, herbs, eye drops, creams, and over-the-counter medicines. Any problems you or family members have had with anesthetic medicines. Any bleeding problems you have. Any surgeries you have had. Any medical conditions you have. Any prostate infections you have had. What are the risks? Generally, this is a safe procedure. However, problems may occur, including: Prostate infection. Bleeding from the rectum. Blood in the urine. Allergic reactions to medicines. Damage to surrounding structures such as blood vessels, organs, or muscles. Difficulty passing urine. Nerve damage. This  is usually temporary. What happens before the procedure? Medicines Ask your health care provider about: Changing or stopping your regular medicines. This is especially important if you are taking diabetes medicines or blood thinners. Taking medicines such as aspirin and ibuprofen. These medicines can thin your blood. Do not take these medicines unless your health care provider tells you to take them. Taking over-the-counter medicines, vitamins, herbs, and supplements. General instructions Follow instructions from your health care provider about  eating and drinking. In most instances, you will not need to stop eating and drinking completely before the procedure. You will be given an enema. During an enema, a liquid is injected into your rectum to clear out waste. You may have a blood or urine sample taken. Ask your health care provider what steps will be taken to help prevent infection. These steps may include: Washing skin with a germ-killing soap. Taking antibiotic medicine. If you will be going home right after the procedure, plan to have a responsible adult: Take you home from the hospital or clinic. You will not be allowed to drive. Care for you for the time you are told. What happens during the procedure?  An IV will be inserted into one of your veins. You will be given one or both of the following: A medicine to help you relax (sedative). A medicine to numb the area (local anesthetic). You will be placed on your left side, and your knees will be bent toward your chest. A probe with lubricated gel will be placed into your rectum, and images will be taken of your prostate and surrounding structures. Numbing medicine will be injected into your prostate. A biopsy needle will be inserted through your rectum or perineum and guided to your prostate using the ultrasound images. Prostate tissue samples will be removed, and the needle and probe will then be removed. The biopsy samples will be sent to a lab to be tested. The procedure may vary among health care providers and hospitals. What happens after the procedure? Your blood pressure, heart rate, breathing rate, and blood oxygen level will be monitored until you leave the hospital or clinic. You may have some discomfort in the rectal area. You will be given pain medicine as needed. If you were given a sedative during the procedure, it can affect you for several hours. Do not drive or operate machinery until your health care provider says that it is safe. It is up to you to get the  results of your procedure. Ask your health care provider, or the department that is doing the procedure, when your results will be ready. Keep all follow-up visits. This is important. Summary A transrectal ultrasound-guided biopsy removes samples of tissue from your prostate using ultrasound-guided sound waves to help guide the process. This procedure is usually done to evaluate the prostate gland of men who have raised (elevated) levels of prostate-specific antigen (PSA), which can be a sign of prostate cancer or prostate enlargement related to aging. After your procedure, you may feel some discomfort in the rectal area. Plan to have a responsible adult take you home from the hospital or clinic, and follow up with your health care provider for your results. This information is not intended to replace advice given to you by your health care provider. Make sure you discuss any questions you have with your health care provider. Document Revised: 04/28/2021 Document Reviewed: 04/28/2021 Elsevier Patient Education  2024 Elsevier Inc.  Prostate Biopsy Instructions  Stop all aspirin or blood thinners (  aspirin, plavix, coumadin, warfarin, motrin, ibuprofen, advil, aleve, naproxen, naprosyn) for 7 days prior to the procedure.  If you have any questions about stopping these medications, please contact your primary care physician or cardiologist.  Having a light meal prior to the procedure is recommended.  If you are diabetic or have low blood sugar please bring a small snack or glucose tablet.  A Fleets enema is needed to be purchased over the counter at a local pharmacy and used 2 hours before you scheduled appointment.  This can be purchased over the counter at any pharmacy.  Antibiotics will be administered in the clinic at the time of the procedure unless otherwise specified.    Please bring someone with you to the procedure to drive you home.  A follow up appointment has been scheduled for you to  receive the results of the biopsy.  If you have any questions or concerns, please feel free to call the office at [phone] or send a Mychart message.    Thank you, Staff at [facility]

## 2023-10-06 NOTE — Progress Notes (Signed)
   10/06/23 11:12 AM   Bill Campbell 10/15/1966 [id number]  CC: Elevated PSA  HPI: Healthy 57 year old male with no family history of prostate or breast cancer who was referred for an elevated PSA of 5.5.  This is increased slowly over the last few years from 3.4, 2.7, 2.0, and 1.5.  He does report that he was sexually active the night before having the PSA of 5.5 drawn.  No recent cross-sectional imaging to review.  Recently underwent rotator cuff surgery and is in a sling today.  He denies any urinary symptoms.  ED on Cialis.  Prior abdominal surgery of laparoscopic appendectomy.   PMH: Past Medical History:  Diagnosis Date   Back pain, chronic    Colon polyp 06/2014   Elevated bilirubin    Elevated lipids    Family history of colon cancer    Gout    Medical history non-contributory    Overweight    Plantar fasciitis     Surgical History: Past Surgical History:  Procedure Laterality Date   APPENDECTOMY     KNEE SURGERY     SHOULDER ARTHROSCOPY WITH SUBACROMIAL DECOMPRESSION AND OPEN ROTATOR C Right 09/24/2023   Procedure: Right shoulder arthroscopic rotator cuff repair (subscapularis and supraspinatus), subacromial decompression, distal clavicle excision, and biceps tenodesis;  Surgeon: Signa Kell, MD;  Location: [facility];  Service: Orthopedics;  Laterality: Right;   TONSILLECTOMY AND ADENOIDECTOMY     VASECTOMY       Family History: Family History  Problem Relation Age of Onset   Hypertension Mother    Osteoporosis Mother    Heart attack Father    Colon cancer Paternal Grandfather     Social History:  reports that he has never smoked. He has never been exposed to tobacco smoke. His smokeless tobacco use includes chew. He reports current alcohol use of about 6.0 standard drinks of alcohol per week. He reports that he does not use drugs.  Physical Exam: BP (!) 156/91   Pulse 61   Ht 6' (1.829 m)   Wt 264 lb (119.7 kg)   BMI 35.80 kg/m     Constitutional:  Alert and oriented, No acute distress. Cardiovascular: No clubbing, cyanosis, or edema. Respiratory: Normal respiratory effort, no increased work of breathing. GI: Abdomen is soft, nontender, nondistended, no abdominal masses   Laboratory Data: PSA history reviewed, see HPI  Assessment & Plan:   Healthy 57 year old male with isolated elevated PSA value of 5.5.  We reviewed the implications of an elevated PSA and the uncertainty surrounding it. In general, a man's PSA increases with age and is produced by both normal and cancerous prostate tissue. The differential diagnosis for elevated PSA includes BPH, prostate cancer, infection, recent intercourse/ejaculation, recent urethroscopic manipulation (foley placement/cystoscopy) or trauma, and prostatitis. Management of an elevated PSA can include observation or prostate biopsy and we discussed this in detail. Our goal is to detect clinically significant prostate cancers, and manage with either active surveillance, surgery, or radiation for localized disease. Risks of prostate biopsy include bleeding, infection (including life threatening sepsis), pain, and lower urinary symptoms. Hematuria, hematospermia, and blood in the stool are all common after biopsy and can persist up to 4 weeks.  We also discussed role of possible prostate MRI.  Repeat PSA reflex to free today, if remains elevated will schedule prostate biopsy   Legrand Rams, MD 10/06/2023  [facility] [address] [place], [place] [address] [phone]

## 2023-10-07 ENCOUNTER — Other Ambulatory Visit: Payer: Self-pay

## 2023-10-07 DIAGNOSIS — M109 Gout, unspecified: Secondary | ICD-10-CM

## 2023-10-07 MED ORDER — INDOMETHACIN 25 MG PO CAPS: 25.0000 mg | ORAL_CAPSULE | Freq: Three times a day (TID) | ORAL | 2 refills | Status: DC

## 2023-10-08 LAB — FPSA% REFLEX
% FREE PSA: 5.8 %
PSA, FREE: 0.26 ng/mL

## 2023-10-08 LAB — PSA (REFLEX TO FREE) (SERIAL): Prostate Specific Ag, Serum: 4.5 ng/mL — ABNORMAL HIGH (ref 0.0–4.0)

## 2023-10-12 ENCOUNTER — Telehealth: Payer: Self-pay

## 2023-10-12 NOTE — Telephone Encounter (Signed)
Patient returned call to schedule biopsy.

## 2023-10-12 NOTE — Telephone Encounter (Signed)
-----   Message from Sondra Come sent at 10/12/2023  8:13 AM EST ----- PSA remains slightly elevated, would recommend prostate biopsy as we discussed in clinic.  Please review instructions and schedule.  Thank you  Legrand Rams, MD 10/12/2023

## 2023-10-12 NOTE — Telephone Encounter (Signed)
Called pt informed him of the need for prostate bx per Dr. Richardo Hanks. Pt voiced understanding. BX scheduled. Patient given verbal and written pre bx instructions via mychart.

## 2023-10-12 NOTE — Telephone Encounter (Signed)
Called pt no answer, Unable to leave voicemail per DPR. LM advising pt to call back. Information also sent via Mychart.

## 2023-10-21 ENCOUNTER — Ambulatory Visit: Payer: 59 | Admitting: Urology

## 2023-10-21 ENCOUNTER — Encounter: Payer: Self-pay | Admitting: Urology

## 2023-10-21 VITALS — BP 161/93 | HR 57

## 2023-10-21 DIAGNOSIS — R972 Elevated prostate specific antigen [PSA]: Secondary | ICD-10-CM

## 2023-10-21 DIAGNOSIS — C61 Malignant neoplasm of prostate: Secondary | ICD-10-CM | POA: Diagnosis not present

## 2023-10-21 DIAGNOSIS — Z2989 Encounter for other specified prophylactic measures: Secondary | ICD-10-CM | POA: Diagnosis not present

## 2023-10-21 MED ORDER — LEVOFLOXACIN 500 MG PO TABS
500.0000 mg | ORAL_TABLET | Freq: Once | ORAL | Status: AC
  Administered 2023-10-21: 500 mg via ORAL

## 2023-10-21 MED ORDER — GENTAMICIN SULFATE 40 MG/ML IJ SOLN
80.0000 mg | Freq: Once | INTRAMUSCULAR | Status: AC
  Administered 2023-10-21: 80 mg via INTRAMUSCULAR

## 2023-10-21 NOTE — Patient Instructions (Signed)

## 2023-10-21 NOTE — Progress Notes (Signed)
   10/21/23  Indication: Elevated PSA, 5.5  Prostate Biopsy Procedure   Informed consent was obtained, and we discussed the risks of bleeding and infection/sepsis. A time out was performed to ensure correct patient identity.  Pre-Procedure: - Last PSA Level: 5.5 - Gentamicin and levaquin given for antibiotic prophylaxis - Transrectal Ultrasound performed revealing a 32 gm prostate, PSA density 0.17 - No significant hypoechoic or median lobe noted  Procedure: - Prostate block performed using 10 cc 1% lidocaine and biopsies taken from sextant areas, a total of 12 under ultrasound guidance.  Post-Procedure: - Patient tolerated the procedure well - He was counseled to seek immediate medical attention if experiences significant bleeding, fevers, or severe pain - Return in one week to discuss biopsy results  Assessment/ Plan: Will follow up in 1-2 weeks to discuss pathology  Legrand Rams, MD 10/21/2023

## 2023-10-25 DIAGNOSIS — Z9889 Other specified postprocedural states: Secondary | ICD-10-CM | POA: Diagnosis not present

## 2023-10-28 ENCOUNTER — Ambulatory Visit (INDEPENDENT_AMBULATORY_CARE_PROVIDER_SITE_OTHER): Payer: 59 | Admitting: Urology

## 2023-10-28 ENCOUNTER — Encounter: Payer: Self-pay | Admitting: Urology

## 2023-10-28 ENCOUNTER — Ambulatory Visit: Payer: 59 | Admitting: Urology

## 2023-10-28 VITALS — BP 158/100 | HR 92 | Ht 72.0 in

## 2023-10-28 DIAGNOSIS — N529 Male erectile dysfunction, unspecified: Secondary | ICD-10-CM

## 2023-10-28 DIAGNOSIS — C61 Malignant neoplasm of prostate: Secondary | ICD-10-CM

## 2023-10-28 NOTE — Progress Notes (Signed)
   10/28/2023 4:17 PM   Bill Campbell 1966/03/16 [id number]  Reason for visit: Review prostate biopsy results, new diagnosis of prostate cancer  HPI: 57 year old healthy male with a slowly rising PSA over the last few years up to 5.5 who underwent prostate biopsy on 10/21/2023.  Biopsy showed 1/12 cores with Gleason score 4+3=7 prostate cancer with max core involvement of 5%.  He has ED well-controlled on Cialis, no significant urinary symptoms.  Currently recovering from shoulder surgery.  Past surgical history notable for laparoscopic appendectomy.  He is here with his wife today.  We had a lengthy conversation today about the patient's new diagnosis of prostate cancer.  We reviewed the risk classifications per the AUA guidelines including very low risk, low risk, intermediate risk, and high risk disease, and the need for additional staging imaging with CT and bone scan in patients with high risk disease.  I explained that his life expectancy, clinical stage, Gleason score, PSA, and other co-morbidities influence treatment strategies.  We discussed the roles of active surveillance, radiation therapy, surgical therapy with robotic prostatectomy, and hormone therapy with androgen deprivation.  We discussed that patients urinary symptoms also impact treatment strategy, as patients with severe lower urinary tract symptoms may have significant worsening or even develop urinary retention after undergoing radiation.  In regards to surgery, we discussed robotic prostatectomy +/- lymphadenectomy at length.  The procedure takes 3 to 4 hours, and patient's typically discharge home on post-op day #1.  A Foley catheter is left in place for 7 to 10 days to allow for healing of the vesicourethral anastomosis.  There is a small risk of bleeding, infection, damage to surrounding structures or bowel, hernia, DVT/PE, or serious cardiac or pulmonary complications.  We discussed at length post-op side effects including  erectile dysfunction, and the importance of pre-operative erectile function on long-term outcomes.  Even with a nerve sparing approach, there is an approximately 25% rate of permanent erectile dysfunction.  We also discussed postop urinary incontinence at length.  We expect patients to have stress incontinence post-operatively that will improve over period of weeks to months.  Less than 10% of men will require a pad at 1 year after surgery.  Patients will need to avoid heavy lifting and strenuous activity for 3 to 4 weeks, but most men return to their baseline activity status by 6 weeks.  In summary, Bill Campbell is a 57 y.o. man with newly diagnosed low volume unfavorable intermediate risk prostate cancer.  He would like to meet with radiation oncology prior to finalizing a treatment strategy.  I think he would do very well with either surgery or radiation based on his low-volume disease.  I spent 40 total minutes on the day of the encounter including pre-visit review of the medical record, face-to-face time with the patient, and post visit ordering of labs/imaging/tests.  Sondra Come, MD  [facility] [address], [address] [place], [place] [address] [phone]

## 2023-10-28 NOTE — Patient Instructions (Addendum)
Your prostate biopsy showed 1 out of 12 cores positive for a very small amount of prostate cancer.  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  The cure rate is very high with either treatment, and either would be very reasonable.  I would encourage you to learn more about each treatment before making a decision, and reach out to [place] if we can help in any way   Prostate Cancer  The prostate is a small gland that produces fluid that makes up semen (seminal fluid). It is located below the bladder in men, in front of the rectum. Prostate cancer is the abnormal growth of cells in the prostate gland. What are the causes? The exact cause of this condition is not known. What increases the risk? You are more likely to develop this condition if: You are 5 years of age or older. You have a family history of prostate cancer. You have a family history of breast and ovarian cancer. You have genes that are passed from parent to child (inherited), such as BRCA1 and BRCA2. You have Lynch syndrome. African American men and men of African descent are diagnosed with prostate cancer at higher rates than other men. The reasons for this are not well understood and are likely due to a combination of genetic and environmental factors. What are the signs or symptoms? Symptoms of this condition include: Problems with urination. This may include: A weak or interrupted flow of urine. Trouble starting or stopping urination. Trouble emptying the bladder all the way. The need to urinate more often, especially at night. Blood in urine or semen. Persistent pain or discomfort in the lower back, lower abdomen, or hips. Trouble getting an erection. Weakness or numbness in the legs or feet. How is this diagnosed? This  condition can be diagnosed with: A digital rectal exam. For this exam, a health care provider inserts a gloved finger into the rectum to feel the prostate gland. A blood test called a prostate-specific antigen (PSA) test. A procedure in which a sample of tissue is taken from the prostate and checked under a microscope (prostate biopsy). An imaging test called transrectal ultrasonography. Once the condition is diagnosed, tests will be done to determine how far the cancer has spread. This is called staging the cancer. Staging may involve imaging tests, such as a bone scan, CT scan, PET scan, or MRI. Stages of prostate cancer The stages of prostate cancer are as follows: Stage 1 (I). At this stage, the cancer is found in the prostate only. The cancer is not visible on imaging tests, and it is usually found by accident, such as during prostate surgery. Stage 2 (II). At this stage, the cancer is more advanced than it is in stage 1, but the cancer has not spread outside the prostate. Stage 3 (III). At this stage, the cancer has spread beyond the outer layer of the prostate to nearby tissues. The cancer may be found in the seminal vesicles, which are near the bladder and the prostate. Stage 4 (IV). At this stage, the cancer has spread to other parts of the body, such as the lymph nodes, bones, bladder, rectum, liver, or lungs. Prostate cancer grading Prostate cancer is also graded according to how the cancer cells look under a microscope. This is called the Gleason score  and the total score can range from 6-10, indicating how likely it is that the cancer will spread (metastasize) to other parts of the body. The higher the score, the greater the likelihood that the cancer will spread. Gleason 6 or lower: This indicates that the cancer cells look similar to normal prostate cells (well differentiated). Gleason 7: This indicates that the cancer cells look somewhat similar to normal prostate cells (moderately  differentiated). Gleason 8, 9, or 10: This indicates that the cancer cells look very different than normal prostate cells (poorly differentiated). How is this treated? Treatment for this condition depends on several factors, including the stage of the cancer, your age, personal preferences, and your overall health. Talk with your health care provider about treatment options that are recommended for you. Common treatments include: Observation for early stage prostate cancer (active surveillance). This involves having exams, blood tests, and in some cases, more biopsies. For some men, this is the only treatment needed. Surgery. Types of surgeries include: Open surgery (radical prostatectomy). In this surgery, a larger incision is made to remove the prostate. A laparoscopic radical prostatectomy. This is a surgery to remove the prostate and lymph nodes through several small incisions. It is often referred to as a minimally invasive surgery. A robotic radical prostatectomy. This is laparoscopic surgery to remove the prostate and lymph nodes with the help of robotic arms that are controlled by the surgeon. Cryoablation. This is surgery to freeze and destroy cancer cells. Radiation treatment. Types of radiation treatment include: External beam radiation. This type aims beams of radiation from outside the body at the prostate to destroy cancerous cells. Brachytherapy. This type uses radioactive needles, seeds, wires, or tubes that are implanted into the prostate gland. Like external beam radiation, brachytherapy destroys cancerous cells. An advantage is that this type of radiation limits the damage to surrounding tissue and has fewer side effects. Chemotherapy. This treatment kills cancer cells or stops them from multiplying. It kills both cancer cells and normal cells. Targeted therapy. This treatment uses medicines to kill cancer cells without damaging normal cells. Hormone treatment. This treatment  involves taking medicines that act on testosterone, one of the male hormones, by: Stopping your body from producing testosterone. Blocking testosterone from reaching cancer cells. Follow these instructions at home: Lifestyle Do not use any products that contain nicotine or tobacco. These products include cigarettes, chewing tobacco, and vaping devices, such as e-cigarettes. If you need help quitting, ask your health care provider. Eat a healthy diet. To do this: Eat foods that are high in fiber. These include beans, whole grains, and fresh fruits and vegetables. Limit foods that are high in fat and sugar. These include fried or sweet foods. Treatment for prostate cancer may affect sexual function. If you have a partner, continue to have intimate moments. This may include touching, holding, hugging, and caressing your partner. Get plenty of sleep. Consider joining a support group for men who have prostate cancer. Meeting with a support group may help you learn to manage the stress of having cancer. General instructions Take over-the-counter and prescription medicines only as told by your health care provider. If you have to go to the hospital, notify your cancer specialist (oncologist). Keep all follow-up visits. This is important. Where to find more information American Cancer Society: www.cancer.org American Society of Clinical Oncology: www.cancer.net [employer]: www.cancer.gov Contact a health care provider if: You have new or increasing trouble urinating. You have new or increasing blood in your urine. You  have new or increasing pain in your hips, back, or chest. Get help right away if: You have weakness or numbness in your legs. You cannot control urination or your bowel movements (incontinence). You have chills or a fever. Summary The prostate is a small gland that is involved in the production of semen. It is located below a man's bladder, in front of the  rectum. Prostate cancer is the abnormal growth of cells in the prostate gland. Treatment for this condition depends on the stage of the cancer, your age, personal preferences, and your overall health. Talk with your health care provider about treatment options that are recommended for you. Consider joining a support group for men who have prostate cancer. Meeting with a support group may help you learn to manage the stress of having cancer. This information is not intended to replace advice given to you by your health care provider. Make sure you discuss any questions you have with your health care provider. Document Revised: 01/29/2021 Document Reviewed: 01/29/2021 Elsevier Patient Education  2024 [employer]  External Beam Radiation Therapy External beam radiation therapy is a type of radiation treatment. This type of radiation therapy can deliver radiation to a fairly large area. This is the most common type of radiation therapy for cancer. It may be done for several purposes: Treating cancer. This is done by: Destroying cancer cells. Radiation delivered during the treatment damages cancer cells. It may also damage normal cells, but normal cells have the DNA to repair themselves and cancer cells do not. Helping with symptoms of a person's cancer. Stopping the growth of any remaining cancer cells after surgery. Preventing cancer cells from growing in areas that do not have cancer (prophylactic radiation therapy). Treating or shrinking a tumor that is not cancerous (is benign). Reducing pain (palliative therapy). The amount of radiation a person receives and the length of therapy depend on the person's medical condition. This treatment is typically provided over many weeks. The person should not feel the radiation being delivered or any pain during the therapy. Let your health care provider know about: Any allergies you have. All medicines you are taking, including vitamins, herbs, eye  drops, creams, and over-the-counter medicines. Any bleeding problems you have. Any surgeries you have had. Any medical conditions you have. Whether you are pregnant or may become pregnant. What are the risks? Generally, this is a safe procedure. However, radiation therapy can place a person at a higher risk for a second cancer later in life. Some people do not have side effects from the therapy. However, most people will have side effects. Side effects depend on the amount of radiation and the part of the body that was exposed to radiation. The most common side effects include: Skin changes. Hair loss. Tiredness (fatigue). Nausea and vomiting. What happens before the procedure? Planning session You will have a planning session (simulation). During the session: Your health care provider will plan exactly where the radiation will be delivered. This area is called the treatment field. You will be positioned for your therapy. The goal is to have a position that can be reproduced for each therapy session. Temporary marks may be drawn on your body. Permanent marks may also be drawn on your body in order for you to be positioned the same way for each therapy session. A tool that holds a body part in place (immobilization device) may be used to keep the area of treatment in the correct position. General instructions Ask your health care  provider about: Changing or stopping your regular medicines. Taking over-the-counter medicines, vitamins, herbs, and supplements. Follow instructions from your health care provider about eating or drinking restrictions. You may want to plan to have a responsible adult take you home from the hospital or clinic. What happens during the procedure?  You will either lie on a table or sit in a chair in the position determined for your therapy. You may have a heavy shield placed on you to protect tissues and organs that are not being treated. The radiation machine  (linear accelerator) will move around you to deliver the radiation in exact doses from different angles. The machine will not touch you. If you had a heavy shield placed on you, it will be removed when the machine is finished delivering radiation. The procedure may vary among health care providers and hospitals. What happens after the procedure? Return to your normal activities as told by your health care provider. Ask your health care provider what activities are safe for you. You may feel very tired. Summary External beam radiation therapy is the most common type of radiation treatment for cancer. The amount of radiation a person will receive and the length of therapy depend on the person's medical condition. Most people have side effects from the therapy. Side effects depend on the amount of radiation and the part of the body that was exposed to radiation. This information is not intended to replace advice given to you by your health care provider. Make sure you discuss any questions you have with your health care provider. Document Revised: 09/02/2021 Document Reviewed: 09/02/2021 Elsevier Patient Education  2024 [employer]  Robot-Assisted Laparoscopic Radical Prostatectomy  Robot-assisted laparoscopic radical prostatectomy is surgery done to remove the entire prostate and nearby tissue. This includes the seminal vesicles, which are near the bladder and the prostate. This procedure is done to treat prostate cancer that has not spread (metastasized) to other parts of the body. The goal of the surgery is to remove all cancer cells to help keep the cancer from metastasizing. During this procedure, the surgeon makes several incisions in the abdomen instead of one large incision. A long, thin, lighted tube with a tiny camera on the end (laparoscope) is put into one of the incisions. This allows the surgeon to see inside the abdomen. Other surgical tools are put in through the other incisions and  used to take out the prostate and nearby tissues. The surgeon uses robotic arms to control these tools while sitting at a computer near the operating table. Lymph nodes in the pelvis may also be removed. Lymph nodes are part of the body's disease-fighting system (immune system). When prostate cancer spreads, it tends to go to the lymph nodes in the pelvis first. If the pelvic lymph nodes are removed, they will be checked for cancer cells. Tell a health care provider about: Any allergies you have. All medicines you are taking, including vitamins, herbs, eye drops, creams, and over-the-counter medicines. Any problems you or family members have had with anesthetic medicines. Any bleeding problems you have. Any surgeries you have had. Any medical conditions you have. Any prostate infections you have had. What are the risks? Generally, this is a safe procedure. Still, problems may occur, including: Infection. Bleeding. Allergic reactions to medicines. Damage to nearby structures or organs, such as the rectum, ureters, urethra, bladder, or small intestine. Blockage (obstruction) of the large or small intestines. Problems that affect urination or sexual function. These may include: Narrowing or scarring  of the urethra (stricture), which may block the flow of urine. Inability to control when you urinate (incontinence). Inability to get or keep an erection (erectile dysfunction). Dry ejaculation. This is when no semen comes out during orgasm. The formation of a sac (cyst) in the pelvis that is filled with fluid from the lymph glands (lymphocele). Blood clots in the legs. What happens before the procedure? Staying hydrated Follow instructions from your health care provider about hydration, which may include: Up to 2 hours before the procedure - you may continue to drink clear liquids, such as water, clear fruit juice, black coffee, and plain tea.  Eating and drinking restrictions Follow  instructions from your health care provider about eating and drinking, which may include: 8 hours before the procedure - stop eating heavy meals or foods, such as meat, fried foods, or fatty foods. 6 hours before the procedure - stop eating light meals or foods, such as toast or cereal. 6 hours before the procedure - stop drinking milk or drinks that contain milk. 2 hours before the procedure - stop drinking clear liquids. Medicines Ask your health care provider about: Changing or stopping your regular medicines. This is especially important if you are taking diabetes medicines or blood thinners. Taking medicines such as aspirin and ibuprofen. These medicines can thin your blood. Do not take these medicines unless your health care provider tells you to take them. Taking over-the-counter medicines, vitamins, herbs, and supplements. Follow your health care provider's instructions about cleaning out your bowels. Surgery safety Ask your health care provider: How your surgery site will be marked. What steps will be taken to help prevent infection. These steps may include: Removing hair at the surgery site. Washing skin with a germ-killing soap. Taking antibiotic medicine. General instructions Do not use any products that contain nicotine or tobacco for at least 4 weeks before the procedure. These products include cigarettes, chewing tobacco, and vaping devices, such as e-cigarettes. If you need help quitting, ask your health care provider. Plan to have a responsible adult take you home from the hospital or clinic. Plan to have a responsible adult care for you for the time you are told after you leave the hospital or clinic. You may have an exam or testing. This may include blood or urine samples, or imaging tests such as a CT scan or an MRI. What happens during the procedure? An IV will be put into a vein in your hand or arm. You may be given: A medicine to help you relax (sedative). A medicine  to make you fall asleep (general anesthetic). A thin, flexible tube (Foley catheter) will be put into your penis through your urethra and into your bladder to drain your urine. Small incisions will be made in your abdomen and near your belly button. The laparoscope and other surgical instruments will be put through the incisions. The surgical tools will be used to cut and remove your prostate, seminal vesicles, and maybe your pelvic lymph nodes. Your surgeon will use a computer and robotic arms to control the surgical instruments. Your urethra will be cut and separated from your bladder to take out the prostate. Your urethra will then be reconnected to your bladder neck. This is the group of muscles that help push urine through your urethra. A small tube (drain) may be put in one or more of your incisions to help drain extra fluid from your surgical site after surgery. The laparoscope and other surgical instruments will be removed. Your incisions  will be closed with stitches (sutures), skin glue, or adhesive strips. Medicine may be applied and bandages (dressings) will be placed over your incisions. The procedure may vary among health care providers and hospitals. What happens after the procedure? Your blood pressure, heart rate, breathing rate, and blood oxygen level will be monitored until you leave the hospital or clinic. You may get fluids and medicines through your IV. You may be given antibiotics and medicines to help relieve pain or nausea. You will be encouraged to walk as soon as possible. You will also use a device or do breathing exercises to keep your lungs clear. The catheter will stay in to drain urine from your bladder. You will be taught how to care for it at home. The drain may stay in to drain fluid from the surgical site. If so, you will be taught how to care for it at home. You may need to wear compression stockings until you are able to get up and walk around. These stockings  help prevent blood clots and reduce swelling in your legs. If you were given a sedative during the procedure, it can affect you for several hours. Do not drive or operate machinery until your health care provider says that it is safe. Summary Robot-assisted laparoscopic radical prostatectomy is a surgical procedure to remove the entire prostate and the seminal vesicles. Follow instructions from your health care provider about eating and drinking before your surgery. After your procedure, you may be given fluids and medicines through an IV. You may get antibiotics and medicines to help relieve pain or nausea. After your surgery, you will continue to have a small, thin tube (Foley catheter) draining your urine. You will be taught how to care for it at home. This information is not intended to replace advice given to you by your health care provider. Make sure you discuss any questions you have with your health care provider. Document Revised: 01/29/2021 Document Reviewed: 01/29/2021 Elsevier Patient Education  2024 [employer].

## 2023-11-02 ENCOUNTER — Ambulatory Visit: Payer: 59 | Admitting: Urology

## 2023-11-03 DIAGNOSIS — Z9889 Other specified postprocedural states: Secondary | ICD-10-CM | POA: Diagnosis not present

## 2023-11-04 ENCOUNTER — Ambulatory Visit
Admission: RE | Admit: 2023-11-04 | Discharge: 2023-11-04 | Disposition: A | Discharge status: Home / Self Care | Payer: 59 | Source: Ambulatory Visit | Attending: Radiation Oncology | Admitting: Radiation Oncology

## 2023-11-04 ENCOUNTER — Encounter: Payer: Self-pay | Admitting: Radiation Oncology

## 2023-11-04 VITALS — BP 144/88 | HR 59 | Temp 98.0°F | Resp 16 | Ht 72.0 in | Wt 266.0 lb

## 2023-11-04 DIAGNOSIS — C61 Malignant neoplasm of prostate: Secondary | ICD-10-CM | POA: Diagnosis not present

## 2023-11-04 DIAGNOSIS — Z191 Hormone sensitive malignancy status: Secondary | ICD-10-CM | POA: Diagnosis not present

## 2023-11-04 NOTE — Consult Note (Signed)
NEW PATIENT EVALUATION  Name: Bill Campbell  MRN: [id number]  Date:   11/04/2023     DOB: 1966-11-05   This 57 y.o. 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  REFERRING PHYSICIAN: Wells Guiles  CHIEF COMPLAINT:  Chief Complaint  Patient presents with   Prostate Cancer    DIAGNOSIS: There were no encounter diagnoses.   PREVIOUS INVESTIGATIONS:  No current films for review. Clinical notes reviewed Pathology reports reviewed  HPI: Patient is a 57 year old male whose had a slowly rising PSA over the past several years.  This month his PSA had gone up to 5.5 he underwent prostate biopsy showing 1 of 12 cores positive for Gleason 7 (4+3).  Patient does have erectile dysfunction controlled on Cialis.  He specifically denies any increased lower urinary tract symptoms diarrhea or fatigue.  He recently had rotator cuff show's shoulder surgery on his right shoulder and that is healing.  PLANNED TREATMENT REGIMEN: Robotic assisted prostatectomy  PAST MEDICAL HISTORY:  has a past medical history of Back pain, chronic, Colon polyp (06/2014), Elevated bilirubin, Elevated lipids, Family history of colon cancer, Gout, Medical history non-contributory, Overweight, and Plantar fasciitis.    PAST SURGICAL HISTORY:  Past Surgical History:  Procedure Laterality Date   APPENDECTOMY     KNEE SURGERY     SHOULDER ARTHROSCOPY WITH SUBACROMIAL DECOMPRESSION AND OPEN ROTATOR C Right 09/24/2023   Procedure: Right shoulder arthroscopic rotator cuff repair (subscapularis and supraspinatus), subacromial decompression, distal clavicle excision, and biceps tenodesis;  Surgeon: Signa Kell, MD;  Location: [facility];  Service: Orthopedics;  Laterality: Right;   TONSILLECTOMY AND ADENOIDECTOMY     VASECTOMY      FAMILY HISTORY: family history includes Colon cancer  in his paternal grandfather; Heart attack in his father; Hypertension in his mother; Osteoporosis in his mother.  SOCIAL HISTORY:  reports that he has never smoked. He has never been exposed to tobacco smoke. His smokeless tobacco use includes chew. He reports current alcohol use of about 6.0 standard drinks of alcohol per week. He reports that he does not use drugs.  ALLERGIES: Penicillins  MEDICATIONS:  Current Outpatient Medications  Medication Sig Dispense Refill   acetaminophen (TYLENOL) 500 MG tablet Take 2 tablets (1,000 mg total) by mouth every 8 (eight) hours. 90 tablet 2   allopurinol (ZYLOPRIM) 300 MG tablet Take 1 tablet (300 mg total) by mouth daily. 90 tablet 3   indomethacin (INDOCIN) 25 MG capsule Take 1 capsule (25 mg total) by mouth 3 (three) times daily with meals. 40 capsule 2   nystatin cream (MYCOSTATIN) Apply 1 Application topically 2 (two) times daily. 30 g 0   ondansetron (ZOFRAN-ODT) 4 MG disintegrating tablet Take 1 tablet (4 mg total) by mouth every 8 (eight) hours as needed for nausea or vomiting. 20 tablet 0   tadalafil (CIALIS) 20 MG tablet Take 0.5-1 tablets (10-20 mg total) by mouth every other day as needed for erectile dysfunction. 10 tablet 11   oxyCODONE (ROXICODONE) 5 MG immediate release tablet Take 1-2 tablets (5-10 mg total) by mouth every 4 (four) hours as needed (pain). (Patient not taking: Reported on 11/04/2023) 30 tablet 0   No current facility-administered medications for this encounter.    ECOG PERFORMANCE STATUS:  0 - Asymptomatic  REVIEW OF SYSTEMS: Patient denies any weight loss, fatigue, weakness, fever, chills or  night sweats. Patient denies any loss of vision, blurred vision. Patient denies any ringing  of the ears or hearing loss. No irregular heartbeat. Patient denies heart murmur or history of fainting. Patient denies any chest pain or pain radiating to her upper extremities. Patient denies any shortness of breath, difficulty breathing  at night, cough or hemoptysis. Patient denies any swelling in the lower legs. Patient denies any nausea vomiting, vomiting of blood, or coffee ground material in the vomitus. Patient denies any stomach pain. Patient states has had normal bowel movements no significant constipation or diarrhea. Patient denies any dysuria, hematuria or significant nocturia. Patient denies any problems walking, swelling in the joints or loss of balance. Patient denies any skin changes, loss of hair or loss of weight. Patient denies any excessive worrying or anxiety or significant depression. Patient denies any problems with insomnia. Patient denies excessive thirst, polyuria, polydipsia. Patient denies any swollen glands, patient denies easy bruising or easy bleeding. Patient denies any recent infections, allergies or URI. Patient "s visual fields have not changed significantly in recent time.   PHYSICAL EXAM: BP (!) 144/88 Comment: patient states he is nervous about DX  Pulse (!) 59   Temp 98 F (36.7 C)   Resp 16   Ht 6' (1.829 m)   Wt 266 lb (120.7 kg)   BMI 36.08 kg/m  Well-developed well-nourished patient in NAD. HEENT reveals PERLA, EOMI, discs not visualized.  Oral cavity is clear. No oral mucosal lesions are identified. Neck is clear without evidence of cervical or supraclavicular adenopathy. Lungs are clear to A&P. Cardiac examination is essentially unremarkable with regular rate and rhythm without murmur rub or thrill. Abdomen is benign with no organomegaly or masses noted. Motor sensory and DTR levels are equal and symmetric in the upper and lower extremities. Cranial nerves II through XII are grossly intact. Proprioception is intact. No peripheral adenopathy or edema is identified. No motor or sensory levels are noted. Crude visual fields are within normal range.  LABORATORY DATA: Pathology reports reviewed    RADIOLOGY RESULTS: No current films for review   IMPRESSION: Stage IIc Gleason 7 (4+3)  adenocarcinoma the prostate and 57 year old male  PLAN: This time of gone over the risks and benefits of radiation therapy for prostate cancer.  My major concern is his young age he is in excellent overall general health and is an excellent candidate for prostatectomy.  I have reviewed with him the problems with salvage treatment after radiation therapy versus salvage is possible with prostatectomy should he develop biochemical failure.  Risks and benefits of radiation were reviewed with the patient.  He is leaning towards prostatectomy I am referring him back to urology.  I believe that is the best option for this patient.  Patient knows to call me in the future with any questions.  I would like to take this opportunity to thank you for allowing me to participate in the care of your patient.Carmina Miller, MD

## 2023-11-08 DIAGNOSIS — Z9889 Other specified postprocedural states: Secondary | ICD-10-CM | POA: Diagnosis not present

## 2023-11-15 ENCOUNTER — Telehealth: Payer: Self-pay

## 2023-11-15 DIAGNOSIS — C61 Malignant neoplasm of prostate: Secondary | ICD-10-CM

## 2023-11-15 NOTE — Telephone Encounter (Signed)
Bill Campbell was referred to Dr. Legrand Rams of [facility] for elevated PSA of 5.5.  Dr. Keane Campbell office repeated the PSA 1 month later & results were 4.5.  S/P Prostate Biopsy on 10/21/2023  Referred to [facility] Oncologist (Dr. Genelle Campbell)  Impression:  Stage Iic Gleason 7 (4+3) adenocarcinoma of the prostate & 57 year old male  Bill Campbell called [facility] requesting a referral to  Dr. Heloise Campbell of [facility] for a 2nd opinion.  States he talked with a few people who have been to Dr. Laverle Campbell and he really wants a 2nd opinion before he decides what direction to take in treatment.  Referral put in Epic for Dr. Heloise Campbell of [facility].

## 2023-11-16 DIAGNOSIS — Z9889 Other specified postprocedural states: Secondary | ICD-10-CM | POA: Diagnosis not present

## 2023-11-22 DIAGNOSIS — Z9889 Other specified postprocedural states: Secondary | ICD-10-CM | POA: Diagnosis not present

## 2023-11-24 DIAGNOSIS — Z9889 Other specified postprocedural states: Secondary | ICD-10-CM | POA: Diagnosis not present

## 2023-11-29 DIAGNOSIS — M25511 Pain in right shoulder: Secondary | ICD-10-CM | POA: Diagnosis not present

## 2023-11-29 DIAGNOSIS — Z9889 Other specified postprocedural states: Secondary | ICD-10-CM | POA: Diagnosis not present

## 2023-12-02 DIAGNOSIS — Z9889 Other specified postprocedural states: Secondary | ICD-10-CM | POA: Diagnosis not present

## 2023-12-10 DIAGNOSIS — Z9889 Other specified postprocedural states: Secondary | ICD-10-CM | POA: Diagnosis not present

## 2023-12-14 DIAGNOSIS — Z9889 Other specified postprocedural states: Secondary | ICD-10-CM | POA: Diagnosis not present

## 2023-12-16 DIAGNOSIS — Z9889 Other specified postprocedural states: Secondary | ICD-10-CM | POA: Diagnosis not present

## 2023-12-17 ENCOUNTER — Telehealth: Payer: Self-pay

## 2023-12-17 NOTE — Telephone Encounter (Signed)
Incoming call from [facility] stating that they have not received a copy of patient's pathology report. Pathology is scanned in media tab under the date 11/15/23. Path report printed and faxed to [facility].

## 2023-12-21 DIAGNOSIS — Z9889 Other specified postprocedural states: Secondary | ICD-10-CM | POA: Diagnosis not present

## 2023-12-21 DIAGNOSIS — C61 Malignant neoplasm of prostate: Secondary | ICD-10-CM | POA: Diagnosis not present

## 2023-12-23 DIAGNOSIS — Z9889 Other specified postprocedural states: Secondary | ICD-10-CM | POA: Diagnosis not present

## 2023-12-28 DIAGNOSIS — Z9889 Other specified postprocedural states: Secondary | ICD-10-CM | POA: Diagnosis not present

## 2023-12-29 DIAGNOSIS — N393 Stress incontinence (female) (male): Secondary | ICD-10-CM | POA: Diagnosis not present

## 2023-12-29 DIAGNOSIS — C61 Malignant neoplasm of prostate: Secondary | ICD-10-CM | POA: Diagnosis not present

## 2023-12-29 DIAGNOSIS — M6281 Muscle weakness (generalized): Secondary | ICD-10-CM | POA: Diagnosis not present

## 2023-12-30 DIAGNOSIS — Z9889 Other specified postprocedural states: Secondary | ICD-10-CM | POA: Diagnosis not present

## 2024-01-04 DIAGNOSIS — Z9889 Other specified postprocedural states: Secondary | ICD-10-CM | POA: Diagnosis not present

## 2024-01-05 DIAGNOSIS — M75121 Complete rotator cuff tear or rupture of right shoulder, not specified as traumatic: Secondary | ICD-10-CM | POA: Diagnosis not present

## 2024-01-11 DIAGNOSIS — Z9889 Other specified postprocedural states: Secondary | ICD-10-CM | POA: Diagnosis not present

## 2024-01-13 ENCOUNTER — Other Ambulatory Visit: Payer: Self-pay | Admitting: Urology

## 2024-01-13 DIAGNOSIS — Z9889 Other specified postprocedural states: Secondary | ICD-10-CM | POA: Diagnosis not present

## 2024-01-13 DIAGNOSIS — M25511 Pain in right shoulder: Secondary | ICD-10-CM | POA: Diagnosis not present

## 2024-01-17 ENCOUNTER — Other Ambulatory Visit: Payer: Self-pay | Admitting: Urology

## 2024-01-18 DIAGNOSIS — Z9889 Other specified postprocedural states: Secondary | ICD-10-CM | POA: Diagnosis not present

## 2024-01-20 DIAGNOSIS — Z9889 Other specified postprocedural states: Secondary | ICD-10-CM | POA: Diagnosis not present

## 2024-01-25 DIAGNOSIS — Z9889 Other specified postprocedural states: Secondary | ICD-10-CM | POA: Diagnosis not present

## 2024-01-27 DIAGNOSIS — Z9889 Other specified postprocedural states: Secondary | ICD-10-CM | POA: Diagnosis not present

## 2024-02-01 DIAGNOSIS — Z9889 Other specified postprocedural states: Secondary | ICD-10-CM | POA: Diagnosis not present

## 2024-02-03 DIAGNOSIS — Z9889 Other specified postprocedural states: Secondary | ICD-10-CM | POA: Diagnosis not present

## 2024-02-08 DIAGNOSIS — Z9889 Other specified postprocedural states: Secondary | ICD-10-CM | POA: Diagnosis not present

## 2024-02-10 DIAGNOSIS — Z9889 Other specified postprocedural states: Secondary | ICD-10-CM | POA: Diagnosis not present

## 2024-02-14 NOTE — Patient Instructions (Signed)
 SURGICAL WAITING ROOM VISITATION  Patients having surgery or a procedure may have no more than 2 support people in the waiting area - these visitors may rotate.    Children under the age of 66 must have an adult with them who is not the patient.  Due to an increase in RSV and influenza rates and associated hospitalizations, children ages 53 and under may not visit patients in [facility] hospitals.  Visitors with respiratory illnesses are discouraged from visiting and should remain at home.  If the patient needs to stay at the hospital during part of their recovery, the visitor guidelines for inpatient rooms apply. Pre-op nurse will coordinate an appropriate time for 1 support person to accompany patient in pre-op.  This support person may not rotate.    Please refer to the [facility] website for the visitor guidelines for Inpatients (after your surgery is over and you are in a regular room).       Your procedure is scheduled on:  02/24/2024    Report to [facility] Main Entrance    Report to admitting at  0515 AM   Call this number if you have problems the morning of surgery [phone]                         Clear lqiuid diet the day before surgery.               Magnesium Citrate- 8 ounces at 12 noon day before surgery            Fleets enema nite before surgery.             Nothing after midnite.                               If you have questions, please contact your surgeon's office.   FOLLOW BOWEL PREP AND ANY ADDITIONAL PRE OP INSTRUCTIONS YOU RECEIVED FROM YOUR SURGEON'S OFFICE!!!     Oral Hygiene is also important to reduce your risk of infection.                                    Remember - BRUSH YOUR TEETH THE MORNING OF SURGERY WITH YOUR REGULAR TOOTHPASTE  DENTURES WILL BE REMOVED PRIOR TO SURGERY PLEASE DO NOT APPLY "Poly grip" OR ADHESIVES!!!   Do NOT smoke after Midnight   Stop all vitamins and herbal supplements 7 days before  surgery.   Take these medicines the morning of surgery with A SIP OF WATER:  allopurinol   DO NOT TAKE ANY ORAL DIABETIC MEDICATIONS DAY OF YOUR SURGERY  Bring CPAP mask and tubing day of surgery.                              You may not have any metal on your body including hair pins, jewelry, and body piercing             Do not wear make-up, lotions, powders, perfumes/cologne, or deodorant  Do not wear nail polish including gel and S&S, artificial/acrylic nails, or any other type of covering on natural nails including finger and toenails. If you have artificial nails, gel coating, etc. that needs to be removed by a nail salon please have this removed prior to  surgery or surgery may need to be canceled/ delayed if the surgeon/ anesthesia feels like they are unable to be safely monitored.   Do not shave  48 hours prior to surgery.               Men may shave face and neck.   Do not bring valuables to the hospital. [facility] IS NOT             RESPONSIBLE   FOR VALUABLES.   Contacts, glasses, dentures or bridgework may not be worn into surgery.   Bring small overnight bag day of surgery.   DO NOT BRING YOUR HOME MEDICATIONS TO THE HOSPITAL. PHARMACY WILL DISPENSE MEDICATIONS LISTED ON YOUR MEDICATION LIST TO YOU DURING YOUR ADMISSION IN THE HOSPITAL!    Patients discharged on the day of surgery will not be allowed to drive home.  Someone NEEDS to stay with you for the first 24 hours after anesthesia.   Special Instructions: Bring a copy of your healthcare power of attorney and living will documents the day of surgery if you haven't scanned them before.              Please read over the following fact sheets you were given: IF YOU HAVE QUESTIONS ABOUT YOUR PRE-OP INSTRUCTIONS PLEASE CALL [phone]   If you received a COVID test during your pre-op visit  it is requested that you wear a mask when out in public, stay away from anyone that may not be feeling well and notify your  surgeon if you develop symptoms. If you test positive for Covid or have been in contact with anyone that has tested positive in the last 10 days please notify you surgeon.     - Preparing for Surgery Before surgery, you can play an important role.  Because skin is not sterile, your skin needs to be as free of germs as possible.  You can reduce the number of germs on your skin by washing with CHG (chlorahexidine gluconate) soap before surgery.  CHG is an antiseptic cleaner which kills germs and bonds with the skin to continue killing germs even after washing. Please DO NOT use if you have an allergy to CHG or antibacterial soaps.  If your skin becomes reddened/irritated stop using the CHG and inform your nurse when you arrive at Short Stay. Do not shave (including legs and underarms) for at least 48 hours prior to the first CHG shower.  You may shave your face/neck. Please follow these instructions carefully:  1.  Shower with CHG Soap the night before surgery and the  morning of Surgery.  2.  If you choose to wash your hair, wash your hair first as usual with your  normal  shampoo.  3.  After you shampoo, rinse your hair and body thoroughly to remove the  shampoo.                           4.  Use CHG as you would any other liquid soap.  You can apply chg directly  to the skin and wash                       Gently with a scrungie or clean washcloth.  5.  Apply the CHG Soap to your body ONLY FROM THE NECK DOWN.   Do not use on face/ open  Wound or open sores. Avoid contact with eyes, ears mouth and genitals (private parts).                       Wash face,  Genitals (private parts) with your normal soap.             6.  Wash thoroughly, paying special attention to the area where your surgery  will be performed.  7.  Thoroughly rinse your body with warm water from the neck down.  8.  DO NOT shower/wash with your normal soap after using and rinsing off  the CHG Soap.                 9.  Pat yourself dry with a clean towel.            10.  Wear clean pajamas.            11.  Place clean sheets on your bed the night of your first shower and do not  sleep with pets. Day of Surgery : Do not apply any lotions/deodorants the morning of surgery.  Please wear clean clothes to the hospital/surgery center.  FAILURE TO FOLLOW THESE INSTRUCTIONS MAY RESULT IN THE CANCELLATION OF YOUR SURGERY PATIENT SIGNATURE_________________________________  NURSE SIGNATURE__________________________________  ________________________________________________________________________

## 2024-02-14 NOTE — Progress Notes (Signed)
 Anesthesia Review:  PCP: Cardiologist :  PPM/ ICD: Device Orders: Rep Notified:  Chest x-ray : EKG : Echo : Stress test: Cardiac Cath :   Activity level:  Sleep Study/ CPAP : Fasting Blood Sugar :      / Checks Blood Sugar -- times a day:    Blood Thinner/ Instructions /Last Dose: ASA / Instructions/ Last Dose :

## 2024-02-15 DIAGNOSIS — Z9889 Other specified postprocedural states: Secondary | ICD-10-CM | POA: Diagnosis not present

## 2024-02-15 DIAGNOSIS — C61 Malignant neoplasm of prostate: Secondary | ICD-10-CM | POA: Diagnosis not present

## 2024-02-16 ENCOUNTER — Encounter (HOSPITAL_COMMUNITY)
Admission: RE | Admit: 2024-02-16 | Discharge: 2024-02-16 | Disposition: A | Discharge status: Home / Self Care | Source: Ambulatory Visit | Attending: Urology | Admitting: Urology

## 2024-02-16 ENCOUNTER — Other Ambulatory Visit: Payer: Self-pay

## 2024-02-16 ENCOUNTER — Encounter (HOSPITAL_COMMUNITY): Payer: Self-pay

## 2024-02-16 VITALS — BP 168/96 | HR 55 | Temp 98.2°F | Resp 16 | Ht 72.0 in | Wt 270.0 lb

## 2024-02-16 DIAGNOSIS — Z01818 Encounter for other preprocedural examination: Secondary | ICD-10-CM

## 2024-02-16 DIAGNOSIS — Z01812 Encounter for preprocedural laboratory examination: Secondary | ICD-10-CM | POA: Diagnosis not present

## 2024-02-16 DIAGNOSIS — C61 Malignant neoplasm of prostate: Secondary | ICD-10-CM | POA: Diagnosis not present

## 2024-02-16 DIAGNOSIS — M6281 Muscle weakness (generalized): Secondary | ICD-10-CM | POA: Diagnosis not present

## 2024-02-16 HISTORY — DX: Unspecified osteoarthritis, unspecified site: M19.90

## 2024-02-16 HISTORY — DX: Unspecified asthma, uncomplicated: J45.909

## 2024-02-16 LAB — CBC
HCT: 49.7 % (ref 39.0–52.0)
Hemoglobin: 16.2 g/dL (ref 13.0–17.0)
MCH: 30.1 pg (ref 26.0–34.0)
MCHC: 32.6 g/dL (ref 30.0–36.0)
MCV: 92.2 fL (ref 80.0–100.0)
Platelets: 245 10*3/uL (ref 150–400)
RBC: 5.39 MIL/uL (ref 4.22–5.81)
RDW: 12.1 % (ref 11.5–15.5)
WBC: 6.1 10*3/uL (ref 4.0–10.5)
nRBC: 0 % (ref 0.0–0.2)

## 2024-02-16 LAB — BASIC METABOLIC PANEL WITH GFR
Anion gap: 7 (ref 5–15)
BUN: 16 mg/dL (ref 6–20)
CO2: 25 mmol/L (ref 22–32)
Calcium: 8.7 mg/dL — ABNORMAL LOW (ref 8.9–10.3)
Chloride: 105 mmol/L (ref 98–111)
Creatinine, Ser: 0.92 mg/dL (ref 0.61–1.24)
GFR, Estimated: >60 mL/min (ref >60)
Glucose, Bld: 110 mg/dL — ABNORMAL HIGH (ref 70–99)
Potassium: 4.7 mmol/L (ref 3.5–5.1)
Sodium: 137 mmol/L (ref 135–145)

## 2024-02-17 ENCOUNTER — Encounter (HOSPITAL_COMMUNITY): Payer: Self-pay

## 2024-02-17 DIAGNOSIS — Z9889 Other specified postprocedural states: Secondary | ICD-10-CM | POA: Diagnosis not present

## 2024-02-18 ENCOUNTER — Other Ambulatory Visit: Payer: Self-pay | Admitting: Physician Assistant

## 2024-02-18 DIAGNOSIS — Z8739 Personal history of other diseases of the musculoskeletal system and connective tissue: Secondary | ICD-10-CM

## 2024-02-23 NOTE — H&P (Signed)
 Office Visit Report     02/15/2024   --------------------------------------------------------------------------------   Bill Campbell  MRN: [id number]  DOB: Mar 30, 1966, 58 year old Male  SSN:    PRIMARY CARE:     REFERRING:    PROVIDER:  Heloise Campbell, M.D.  TREATING:  Bill Campbell, [place]  LOCATION:  [facility] [phone]     --------------------------------------------------------------------------------   CC/HPI: Pt presents today for pre-operative history and physical exam in anticipation of robotic assisted lap radical prostatectomy with bilateral pelvic lymph node dissection by Dr. Laverle Campbell on 02/24/24. He is doing well and is without complaint.   Pt denies F/C, HA, CP, SOB, N/V, diarrhea/constipation, back pain, flank pain, hematuria, and dysuria.     HX:    CC: Prostate Cancer   Physician requesting consult: Dr. Legrand Campbell  PCP: Bill Dell, PA-C   Bill Campbell is a 58 year old gentleman who was found to have a persistently elevated PSA of 4.5 prompting a TRUS biopsy by Dr. Legrand Campbell on 10/21/23. This confirmed Gleason 4+3=7 adenocarcinoma in 1 out of 12 biopsy cores. He has been counseled by Dr. Richardo Campbell and Dr. Rushie Campbell (radiation oncology).   Family history: None.   Imaging studies: None.   PMH: He has a history of gout.  PSH: Laparoscopic appendectomy.   TNM stage: cT1c Nx Mx  PSA: 4.5  Gleason score: 4+3=7 (GG 3)  Biopsy (10/21/23 - [facility] [id number], Read by Dr. Wenda Campbell): 1/12 cores positive  Left: Benign  Right: R lateral base (5%, 4+3=7)  Prostate volume: 32 cc   Nomogram  OC disease: 69%  EPE: 28%  SVI: 3%  LNI: 5%  PFS (5 year, 10 year): 75%, 62%   Urinary function: IPSS is 2.  Erectile function: SHIM score is 25.     ALLERGIES: penicillin - unknown    MEDICATIONS: Allopurinol 300 MG Tablet  Indomethacin  Tylenol     GU PSH: None     PSH Notes: knee surgery for meniscus tear   NON-GU PSH:  Appendectomy (laparoscopic) Rotator cuff surgery     GU PMH: Prostate Cancer - 12/29/2023, - 12/21/2023 Stress Incontinence - 12/29/2023    NON-GU PMH: Muscle weakness (generalized) - 12/29/2023 Arthritis Gout    FAMILY HISTORY: Prostate Cancer - No Family History   SOCIAL HISTORY: Marital Status: Married Current Smoking Status: Patient has never smoked.   Tobacco Use Assessment Completed: Used Tobacco in last 30 days? Does not use smokeless tobacco. Does drink.  Does not use drugs. Drinks 2 caffeinated drinks per day. Has not had a blood transfusion. Patient's occupation Dance movement psychotherapist.     Notes: Quit tobacco pouches Dec 2024; now using Black buffalo with no tobacco product in it  ETOH 2-3 beers every other week occasional bourbon    REVIEW OF SYSTEMS:    GU Review Male:   Patient denies frequent urination, hard to postpone urination, burning/ pain with urination, get up at night to urinate, leakage of urine, stream starts and stops, trouble starting your stream, have to strain to urinate , erection problems, and penile pain.  Gastrointestinal (Upper):   Patient denies nausea, vomiting, and indigestion/ heartburn.  Gastrointestinal (Lower):   Patient denies diarrhea and constipation.  Constitutional:   Patient denies fever, night sweats, weight loss, and fatigue.  Skin:   Patient denies skin rash/ lesion and itching.  Eyes:   Patient denies blurred vision and double vision.  Ears/ Nose/ Throat:   Patient  denies sore throat and sinus problems.  Hematologic/Lymphatic:   Patient denies swollen glands and easy bruising.  Cardiovascular:   Patient denies leg swelling and chest pains.  Respiratory:   Patient denies cough and shortness of breath.  Endocrine:   Patient denies excessive thirst.  Musculoskeletal:   Patient denies back pain and joint pain.  Neurological:   Patient denies headaches and dizziness.  Psychologic:   Patient denies depression and  anxiety.   VITAL SIGNS:      02/15/2024 01:58 PM  Weight 260 lb / 117.93 kg  Height 72 in / 182.88 cm  BP 125/87 mmHg  Heart Rate 78 /min  Temperature 98.2 F / 36.7 C  BMI 35.3 kg/m   MULTI-SYSTEM PHYSICAL EXAMINATION:    Constitutional: Well-nourished. No physical deformities. Normally developed. Good grooming.  Neck: Neck symmetrical, not swollen. Normal tracheal position.  Respiratory: Normal breath sounds. No labored breathing, no use of accessory muscles.   Cardiovascular: Regular rate and rhythm. No murmur, no gallop  Lymphatic: No enlargement of neck, axillae, groin.  Skin: No paleness, no jaundice, no cyanosis. No lesion, no ulcer, no rash.  Neurologic / Psychiatric: Oriented to time, oriented to place, oriented to person. No depression, no anxiety, no agitation.  Gastrointestinal: No mass, no tenderness, no rigidity, obese abdomen.   Eyes: Normal conjunctivae. Normal eyelids.  Ears, Nose, Mouth, and Throat: Left ear no scars, no lesions, no masses. Right ear no scars, no lesions, no masses. Nose no scars, no lesions, no masses. Normal hearing. Normal lips.  Musculoskeletal: Normal gait and station of head and neck.     Complexity of Data:  Records Review:   Previous Patient Records  Urine Test Review:   Urinalysis   02/15/24  Urinalysis  Urine Appearance Clear   Urine Color Yellow   Urine Glucose Neg mg/dL  Urine Bilirubin Neg mg/dL  Urine Ketones Neg mg/dL  Urine Specific Gravity 1.015   Urine Blood Neg ery/uL  Urine pH 7.0   Urine Protein Trace mg/dL  Urine Urobilinogen 0.2 mg/dL  Urine Nitrites Neg   Urine Leukocyte Esterase Neg leu/uL   PROCEDURES:          Urinalysis - 81003 Dipstick Dipstick Cont'd  Color: Yellow Bilirubin: Neg mg/dL  Appearance: Clear Ketones: Neg mg/dL  Specific Gravity: 0.347 Blood: Neg ery/uL  pH: 7.0 Protein: Trace mg/dL  Glucose: Neg mg/dL Urobilinogen: 0.2 mg/dL    Nitrites: Neg    Leukocyte Esterase: Neg leu/uL     ASSESSMENT:      ICD-10 Details  1 GU:   Prostate Cancer - C61    PLAN:           Schedule Return Visit/Planned Activity: Keep Scheduled Appointment - Schedule Surgery          Document Letter(s):  Created for Patient: Clinical Summary         Notes:   There are no changes in the patients history or physical exam since last evaluation by Dr. Laverle Campbell. Pt is scheduled to undergo RALP with BPLND on 02/24/24.   All pt's questions were answered to the best of my ability.          Next Appointment:      Next Appointment: 02/16/2024 10:00 AM    Appointment Type: 60 Physical Therapy    Location: [facility] [phone]    Provider: Luanna Salk    Reason for Visit: pt tx      * Signed by Marchelle Folks  Valentino Hue, PA on 02/15/24 at 2:40 PM (EDT)*

## 2024-02-24 ENCOUNTER — Encounter (HOSPITAL_COMMUNITY)
Admission: RE | Disposition: A | Discharge status: Home / Self Care | Payer: Self-pay | Source: Home / Self Care | Attending: Urology

## 2024-02-24 ENCOUNTER — Other Ambulatory Visit: Payer: Self-pay

## 2024-02-24 ENCOUNTER — Ambulatory Visit (HOSPITAL_COMMUNITY): Payer: Self-pay | Admitting: Medical

## 2024-02-24 ENCOUNTER — Ambulatory Visit (HOSPITAL_BASED_OUTPATIENT_CLINIC_OR_DEPARTMENT_OTHER): Admitting: Anesthesiology

## 2024-02-24 ENCOUNTER — Observation Stay (HOSPITAL_COMMUNITY)
Admission: RE | Admit: 2024-02-24 | Discharge: 2024-02-25 | Disposition: A | Discharge status: Home / Self Care | Payer: 59 | Attending: Urology | Admitting: Urology

## 2024-02-24 ENCOUNTER — Encounter (HOSPITAL_COMMUNITY): Payer: Self-pay | Admitting: Urology

## 2024-02-24 DIAGNOSIS — C61 Malignant neoplasm of prostate: Principal | ICD-10-CM | POA: Diagnosis present

## 2024-02-24 DIAGNOSIS — C775 Secondary and unspecified malignant neoplasm of intrapelvic lymph nodes: Secondary | ICD-10-CM | POA: Diagnosis not present

## 2024-02-24 DIAGNOSIS — Z01818 Encounter for other preprocedural examination: Secondary | ICD-10-CM

## 2024-02-24 HISTORY — PX: ROBOT ASSISTED LAPAROSCOPIC RADICAL PROSTATECTOMY: SHX5141

## 2024-02-24 HISTORY — PX: PELVIC LYMPH NODE DISSECTION: SHX6543

## 2024-02-24 LAB — TYPE AND SCREEN
ABO/RH(D): A POS
Antibody Screen: NEGATIVE

## 2024-02-24 LAB — HEMOGLOBIN AND HEMATOCRIT, BLOOD
HCT: 44.4 % (ref 39.0–52.0)
Hemoglobin: 15 g/dL (ref 13.0–17.0)

## 2024-02-24 LAB — ABO/RH: ABO/RH(D): A POS

## 2024-02-24 SURGERY — XI ROBOTIC ASSISTED LAPAROSCOPIC RADICAL PROSTATECTOMY LEVEL 2
Anesthesia: General | Site: Abdomen

## 2024-02-24 MED ORDER — MORPHINE SULFATE (PF) 2 MG/ML IV SOLN
2.0000 mg | INTRAVENOUS | Status: DC | PRN
  Administered 2024-02-24 – 2024-02-25 (×2): 2 mg via INTRAVENOUS
  Filled 2024-02-24: qty 1

## 2024-02-24 MED ORDER — TRAMADOL HCL 50 MG PO TABS: 50.0000 mg | ORAL_TABLET | Freq: Four times a day (QID) | ORAL | 0 refills | Status: AC | PRN

## 2024-02-24 MED ORDER — PROPOFOL 10 MG/ML IV BOLUS
INTRAVENOUS | Status: AC
  Filled 2024-02-24: qty 20

## 2024-02-24 MED ORDER — ALBUTEROL SULFATE HFA 108 (90 BASE) MCG/ACT IN AERS
INHALATION_SPRAY | RESPIRATORY_TRACT | Status: DC | PRN
  Administered 2024-02-24: 2 via RESPIRATORY_TRACT

## 2024-02-24 MED ORDER — ONDANSETRON HCL 4 MG/2ML IJ SOLN
INTRAMUSCULAR | Status: AC
  Filled 2024-02-24: qty 2

## 2024-02-24 MED ORDER — KCL IN DEXTROSE-NACL 20-5-0.45 MEQ/L-%-% IV SOLN
INTRAVENOUS | Status: DC
  Filled 2024-02-24 (×3): qty 1000

## 2024-02-24 MED ORDER — ACETAMINOPHEN 325 MG PO TABS: 650.0000 mg | ORAL_TABLET | ORAL | Status: DC | PRN

## 2024-02-24 MED ORDER — ROCURONIUM BROMIDE 100 MG/10ML IV SOLN
INTRAVENOUS | Status: DC | PRN
  Administered 2024-02-24: 60 mg via INTRAVENOUS
  Administered 2024-02-24 (×2): 20 mg via INTRAVENOUS

## 2024-02-24 MED ORDER — FENTANYL CITRATE PF 50 MCG/ML IJ SOSY
PREFILLED_SYRINGE | INTRAMUSCULAR | Status: AC
  Filled 2024-02-24: qty 3

## 2024-02-24 MED ORDER — FENTANYL CITRATE (PF) 100 MCG/2ML IJ SOLN
INTRAMUSCULAR | Status: DC | PRN
  Administered 2024-02-24: 50 ug via INTRAVENOUS
  Administered 2024-02-24: 100 ug via INTRAVENOUS
  Administered 2024-02-24: 50 ug via INTRAVENOUS

## 2024-02-24 MED ORDER — LABETALOL HCL 5 MG/ML IV SOLN
INTRAVENOUS | Status: AC
  Filled 2024-02-24: qty 4

## 2024-02-24 MED ORDER — HYDROMORPHONE HCL 1 MG/ML IJ SOLN
INTRAMUSCULAR | Status: AC
Start: 2024-02-24 — End: 2024-02-24
  Filled 2024-02-24: qty 2

## 2024-02-24 MED ORDER — LACTATED RINGERS IV SOLN: INTRAVENOUS | Status: DC | PRN

## 2024-02-24 MED ORDER — SULFAMETHOXAZOLE-TRIMETHOPRIM 800-160 MG PO TABS: 1.0000 | ORAL_TABLET | Freq: Two times a day (BID) | ORAL | 0 refills | Status: AC

## 2024-02-24 MED ORDER — PHENYLEPHRINE 80 MCG/ML (10ML) SYRINGE FOR IV PUSH (FOR BLOOD PRESSURE SUPPORT)
PREFILLED_SYRINGE | INTRAVENOUS | Status: AC
  Filled 2024-02-24: qty 10

## 2024-02-24 MED ORDER — DIPHENHYDRAMINE HCL 12.5 MG/5ML PO ELIX: 12.5000 mg | ORAL_SOLUTION | Freq: Four times a day (QID) | ORAL | Status: DC | PRN

## 2024-02-24 MED ORDER — EPHEDRINE SULFATE-NACL 50-0.9 MG/10ML-% IV SOSY
PREFILLED_SYRINGE | INTRAVENOUS | Status: DC | PRN
  Administered 2024-02-24: 10 mg via INTRAVENOUS

## 2024-02-24 MED ORDER — ALBUTEROL SULFATE HFA 108 (90 BASE) MCG/ACT IN AERS
INHALATION_SPRAY | RESPIRATORY_TRACT | Status: AC
Start: 2024-02-24 — End: ?
  Filled 2024-02-24: qty 6.7

## 2024-02-24 MED ORDER — LIDOCAINE HCL (CARDIAC) PF 100 MG/5ML IV SOSY
PREFILLED_SYRINGE | INTRAVENOUS | Status: DC | PRN
  Administered 2024-02-24: 100 mg via INTRATRACHEAL

## 2024-02-24 MED ORDER — OXYCODONE HCL 5 MG/5ML PO SOLN: 5.0000 mg | Freq: Once | ORAL | Status: AC | PRN

## 2024-02-24 MED ORDER — PROPOFOL 10 MG/ML IV BOLUS
INTRAVENOUS | Status: DC | PRN
  Administered 2024-02-24: 200 mg via INTRAVENOUS

## 2024-02-24 MED ORDER — ORAL CARE MOUTH RINSE: 15.0000 mL | Freq: Once | OROMUCOSAL | Status: AC

## 2024-02-24 MED ORDER — PHENYLEPHRINE HCL (PRESSORS) 10 MG/ML IV SOLN
INTRAVENOUS | Status: AC
  Filled 2024-02-24: qty 1

## 2024-02-24 MED ORDER — HYOSCYAMINE SULFATE 0.125 MG SL SUBL: 0.1250 mg | SUBLINGUAL_TABLET | Freq: Four times a day (QID) | SUBLINGUAL | Status: DC | PRN

## 2024-02-24 MED ORDER — MORPHINE SULFATE (PF) 2 MG/ML IV SOLN
1.0000 mg | INTRAVENOUS | Status: DC | PRN
  Filled 2024-02-24: qty 1

## 2024-02-24 MED ORDER — FENTANYL CITRATE (PF) 100 MCG/2ML IJ SOLN
INTRAMUSCULAR | Status: AC
  Filled 2024-02-24: qty 2

## 2024-02-24 MED ORDER — TRIPLE ANTIBIOTIC 3.5-400-5000 EX OINT: 1.0000 | TOPICAL_OINTMENT | Freq: Three times a day (TID) | CUTANEOUS | Status: DC | PRN

## 2024-02-24 MED ORDER — CEFAZOLIN SODIUM-DEXTROSE 3-4 GM/150ML-% IV SOLN
3.0000 g | Freq: Once | INTRAVENOUS | Status: AC
  Administered 2024-02-24: 3 g via INTRAVENOUS
  Filled 2024-02-24: qty 150

## 2024-02-24 MED ORDER — DEXAMETHASONE SODIUM PHOSPHATE 10 MG/ML IJ SOLN
INTRAMUSCULAR | Status: DC | PRN
Start: 2024-02-24 — End: 2024-02-24
  Administered 2024-02-24: 8 mg via INTRAVENOUS

## 2024-02-24 MED ORDER — CEFAZOLIN SODIUM-DEXTROSE 1-4 GM/50ML-% IV SOLN
1.0000 g | Freq: Three times a day (TID) | INTRAVENOUS | Status: AC
  Administered 2024-02-24 – 2024-02-25 (×2): 1 g via INTRAVENOUS
  Filled 2024-02-24 (×2): qty 50

## 2024-02-24 MED ORDER — SUGAMMADEX SODIUM 200 MG/2ML IV SOLN
INTRAVENOUS | Status: DC | PRN
  Administered 2024-02-24: 244 mg via INTRAVENOUS

## 2024-02-24 MED ORDER — SODIUM CHLORIDE 0.9 % IV BOLUS
1000.0000 mL | Freq: Once | INTRAVENOUS | Status: AC
  Administered 2024-02-24: 1000 mL via INTRAVENOUS

## 2024-02-24 MED ORDER — ZOLPIDEM TARTRATE 5 MG PO TABS: 5.0000 mg | ORAL_TABLET | Freq: Every evening | ORAL | Status: DC | PRN

## 2024-02-24 MED ORDER — ONDANSETRON HCL 4 MG/2ML IJ SOLN
INTRAMUSCULAR | Status: DC | PRN
  Administered 2024-02-24: 4 mg via INTRAVENOUS

## 2024-02-24 MED ORDER — ALBUMIN HUMAN 5 % IV SOLN: INTRAVENOUS | Status: DC | PRN

## 2024-02-24 MED ORDER — ALLOPURINOL 300 MG PO TABS
300.0000 mg | ORAL_TABLET | Freq: Every day | ORAL | Status: DC
  Administered 2024-02-24 – 2024-02-25 (×2): 300 mg via ORAL
  Filled 2024-02-24 (×2): qty 1

## 2024-02-24 MED ORDER — ROCURONIUM BROMIDE 10 MG/ML (PF) SYRINGE
PREFILLED_SYRINGE | INTRAVENOUS | Status: AC
  Filled 2024-02-24: qty 10

## 2024-02-24 MED ORDER — LIDOCAINE HCL (PF) 2 % IJ SOLN
INTRAMUSCULAR | Status: AC
  Filled 2024-02-24: qty 5

## 2024-02-24 MED ORDER — FENTANYL CITRATE PF 50 MCG/ML IJ SOSY
25.0000 ug | PREFILLED_SYRINGE | INTRAMUSCULAR | Status: DC | PRN
  Administered 2024-02-24 (×2): 50 ug via INTRAVENOUS

## 2024-02-24 MED ORDER — DIPHENHYDRAMINE HCL 50 MG/ML IJ SOLN: 12.5000 mg | Freq: Four times a day (QID) | INTRAMUSCULAR | Status: DC | PRN

## 2024-02-24 MED ORDER — DEXAMETHASONE SODIUM PHOSPHATE 10 MG/ML IJ SOLN
INTRAMUSCULAR | Status: AC
  Filled 2024-02-24: qty 1

## 2024-02-24 MED ORDER — STERILE WATER FOR IRRIGATION IR SOLN
Status: DC | PRN
  Administered 2024-02-24: 1000 mL

## 2024-02-24 MED ORDER — LABETALOL HCL 5 MG/ML IV SOLN: 10.0000 mg | Freq: Once | INTRAVENOUS | Status: DC

## 2024-02-24 MED ORDER — DOCUSATE SODIUM 100 MG PO CAPS
100.0000 mg | ORAL_CAPSULE | Freq: Two times a day (BID) | ORAL | Status: DC
  Administered 2024-02-24 – 2024-02-25 (×2): 100 mg via ORAL
  Filled 2024-02-24 (×2): qty 1

## 2024-02-24 MED ORDER — CHLORHEXIDINE GLUCONATE 0.12 % MT SOLN
15.0000 mL | Freq: Once | OROMUCOSAL | Status: AC
  Administered 2024-02-24: 15 mL via OROMUCOSAL

## 2024-02-24 MED ORDER — OXYCODONE HCL 5 MG PO TABS
ORAL_TABLET | ORAL | Status: AC
  Filled 2024-02-24: qty 1

## 2024-02-24 MED ORDER — OXYCODONE HCL 5 MG PO TABS
5.0000 mg | ORAL_TABLET | Freq: Once | ORAL | Status: AC | PRN
  Administered 2024-02-24: 5 mg via ORAL

## 2024-02-24 MED ORDER — ALBUMIN HUMAN 5 % IV SOLN
INTRAVENOUS | Status: AC
  Filled 2024-02-24: qty 250

## 2024-02-24 MED ORDER — BUPIVACAINE-EPINEPHRINE 0.25% -1:200000 IJ SOLN
INTRAMUSCULAR | Status: DC | PRN
  Administered 2024-02-24: 30 mL

## 2024-02-24 MED ORDER — LACTATED RINGERS IV SOLN: INTRAVENOUS | Status: DC

## 2024-02-24 MED ORDER — DOCUSATE SODIUM 100 MG PO CAPS: 100.0000 mg | ORAL_CAPSULE | Freq: Two times a day (BID) | ORAL | Status: AC

## 2024-02-24 MED ORDER — ONDANSETRON HCL 4 MG/2ML IJ SOLN: 4.0000 mg | INTRAMUSCULAR | Status: DC | PRN

## 2024-02-24 MED ORDER — DROPERIDOL 2.5 MG/ML IJ SOLN: 0.6250 mg | Freq: Once | INTRAMUSCULAR | Status: DC | PRN

## 2024-02-24 MED ORDER — KETOROLAC TROMETHAMINE 15 MG/ML IJ SOLN
15.0000 mg | Freq: Four times a day (QID) | INTRAMUSCULAR | Status: DC
  Administered 2024-02-24 – 2024-02-25 (×4): 15 mg via INTRAVENOUS
  Filled 2024-02-24 (×4): qty 1

## 2024-02-24 MED ORDER — MIDAZOLAM HCL 2 MG/2ML IJ SOLN
INTRAMUSCULAR | Status: AC
  Filled 2024-02-24: qty 2

## 2024-02-24 MED ORDER — HYDROMORPHONE HCL 1 MG/ML IJ SOLN
0.5000 mg | INTRAMUSCULAR | Status: AC | PRN
  Administered 2024-02-24 (×4): 0.5 mg via INTRAVENOUS

## 2024-02-24 MED ORDER — BUPIVACAINE-EPINEPHRINE (PF) 0.25% -1:200000 IJ SOLN
INTRAMUSCULAR | Status: AC
  Filled 2024-02-24: qty 30

## 2024-02-24 SURGICAL SUPPLY — 58 items
APPLICATOR COTTON TIP 6 STRL (MISCELLANEOUS) ×2 IMPLANT
APPLICATOR COTTON TIP 6IN STRL (MISCELLANEOUS) ×2 IMPLANT
BAG COUNTER SPONGE SURGICOUNT (BAG) IMPLANT
CATH FOLEY 2WAY SLVR 18FR 30CC (CATHETERS) ×2 IMPLANT
CATH ROBINSON RED A/P 16FR (CATHETERS) ×2 IMPLANT
CATH ROBINSON RED A/P 8FR (CATHETERS) ×2 IMPLANT
CATH TIEMANN FOLEY 18FR 5CC (CATHETERS) ×2 IMPLANT
CHLORAPREP W/TINT 26 (MISCELLANEOUS) ×2 IMPLANT
CLIP LIGATING HEM O LOK PURPLE (MISCELLANEOUS) ×2 IMPLANT
COVER SURGICAL LIGHT HANDLE (MISCELLANEOUS) ×2 IMPLANT
COVER TIP SHEARS 8 DVNC (MISCELLANEOUS) ×2 IMPLANT
CUTTER ECHEON FLEX ENDO 45 340 (ENDOMECHANICALS) ×2 IMPLANT
DERMABOND ADVANCED .7 DNX12 (GAUZE/BANDAGES/DRESSINGS) ×2 IMPLANT
DRAIN CHANNEL RND F F (WOUND CARE) IMPLANT
DRAPE ARM DVNC X/XI (DISPOSABLE) ×8 IMPLANT
DRAPE COLUMN DVNC XI (DISPOSABLE) ×2 IMPLANT
DRAPE SURG IRRIG POUCH 19X23 (DRAPES) ×2 IMPLANT
DRIVER NDL LRG 8 DVNC XI (INSTRUMENTS) ×4 IMPLANT
DRIVER NDLE LRG 8 DVNC XI (INSTRUMENTS) ×4 IMPLANT
DRSG TEGADERM 4X4.75 (GAUZE/BANDAGES/DRESSINGS) ×2 IMPLANT
ELECT PENCIL ROCKER SW 15FT (MISCELLANEOUS) ×2 IMPLANT
ELECT REM PT RETURN 15FT ADLT (MISCELLANEOUS) ×2 IMPLANT
FORCEPS BPLR LNG DVNC XI (INSTRUMENTS) ×2 IMPLANT
FORCEPS PROGRASP DVNC XI (FORCEP) ×2 IMPLANT
GAUZE SPONGE 4X4 12PLY STRL (GAUZE/BANDAGES/DRESSINGS) ×2 IMPLANT
GLOVE BIO SURGEON STRL SZ 6.5 (GLOVE) ×2 IMPLANT
GLOVE SURG LX STRL 7.5 STRW (GLOVE) ×4 IMPLANT
GOWN STRL REUS W/ TWL XL LVL3 (GOWN DISPOSABLE) ×4 IMPLANT
GOWN STRL SURGICAL XL XLNG (GOWN DISPOSABLE) ×2 IMPLANT
HOLDER FOLEY CATH W/STRAP (MISCELLANEOUS) ×2 IMPLANT
IRRIG SUCT STRYKERFLOW 2 WTIP (MISCELLANEOUS) ×2 IMPLANT
IRRIGATION SUCT STRKRFLW 2 WTP (MISCELLANEOUS) ×2 IMPLANT
IV LACTATED RINGERS 1000ML (IV SOLUTION) ×2 IMPLANT
KIT TURNOVER KIT A (KITS) IMPLANT
NDL SAFETY ECLIPSE 18X1.5 (NEEDLE) IMPLANT
PACK ROBOT UROLOGY CUSTOM (CUSTOM PROCEDURE TRAY) ×2 IMPLANT
PLUG CATH AND CAP STRL 200 (CATHETERS) ×2 IMPLANT
RELOAD STAPLE 45 4.1 GRN THCK (STAPLE) ×2 IMPLANT
SCISSORS MNPLR CVD DVNC XI (INSTRUMENTS) ×2 IMPLANT
SEAL UNIV 5-12 XI (MISCELLANEOUS) ×8 IMPLANT
SET CYSTO W/LG BORE CLAMP LF (SET/KITS/TRAYS/PACK) IMPLANT
SET TUBE SMOKE EVAC HIGH FLOW (TUBING) ×2 IMPLANT
SOL ELECTROSURG ANTI STICK (MISCELLANEOUS) ×2 IMPLANT
SOL PREP POV-IOD 4OZ 10% (MISCELLANEOUS) ×2 IMPLANT
SOLUTION ELECTROSURG ANTI STCK (MISCELLANEOUS) ×2 IMPLANT
SPIKE FLUID TRANSFER (MISCELLANEOUS) ×2 IMPLANT
STAPLE RELOAD 45 GRN (STAPLE) ×2 IMPLANT
SUT ETHILON 3 0 PS 1 (SUTURE) ×2 IMPLANT
SUT MNCRL 3 0 RB1 (SUTURE) ×2 IMPLANT
SUT MNCRL 3 0 VIOLET RB1 (SUTURE) ×2 IMPLANT
SUT MNCRL AB 4-0 PS2 18 (SUTURE) ×4 IMPLANT
SUT PDS PLUS AB 0 CT-2 (SUTURE) ×4 IMPLANT
SUT VIC AB 0 CT1 27XBRD ANTBC (SUTURE) ×4 IMPLANT
SUT VIC AB 2-0 SH 27X BRD (SUTURE) ×2 IMPLANT
SUT VIC AB 3-0 SH 27X BRD (SUTURE) IMPLANT
SYR 27GX1/2 1ML LL SAFETY (SYRINGE) ×2 IMPLANT
TROCAR Z THREAD OPTICAL 12X100 (TROCAR) IMPLANT
WATER STERILE IRR 1000ML POUR (IV SOLUTION) ×2 IMPLANT

## 2024-02-24 NOTE — Anesthesia Procedure Notes (Signed)
 Procedure Name: Intubation Date/Time: 02/24/2024 7:35 AM  Performed by: Micki Riley, CRNAPre-anesthesia Checklist: Patient identified, Emergency Drugs available, Suction available and Patient being monitored Patient Re-evaluated:Patient Re-evaluated prior to induction Oxygen Delivery Method: Circle System Utilized Preoxygenation: Pre-oxygenation with 100% oxygen Induction Type: IV induction Ventilation: Mask ventilation without difficulty Laryngoscope Size: Glidescope and 3 Grade View: Grade II Tube type: Oral Tube size: 7.5 mm Number of attempts: 1 Airway Equipment and Method: Stylet and Oral airway Placement Confirmation: ETT inserted through vocal cords under direct vision, positive ETCO2 and breath sounds checked- equal and bilateral Secured at: 23 cm Tube secured with: Tape Dental Injury: Teeth and Oropharynx as per pre-operative assessment

## 2024-02-24 NOTE — Progress Notes (Signed)
 Patient ID: Bill Campbell, male   DOB: September 14, 1966, 58 y.o.   MRN: [id number]  Post-op note  Subjective: The patient is doing well.  No complaints.  Objective: Vital signs in last 24 hours: Temp:  [98.1 F (36.7 C)-98.4 F (36.9 C)] 98.4 F (36.9 C) (04/10 0952) Pulse Rate:  [50-73] 58 (04/10 1430) Resp:  [8-19] 11 (04/10 1430) BP: (125-201)/(77-114) 166/97 (04/10 1430) SpO2:  [93 %-100 %] 100 % (04/10 1430) Weight:  [122.5 kg] 122.5 kg (04/10 0551)  Intake/Output from previous day: No intake/output data recorded. Intake/Output this shift: Total I/O In: 1350 [I.V.:1100; IV Piggyback:250] Out: 75 [Blood:75]  Physical Exam:  General: Alert and oriented. Abdomen: Soft, Nondistended. Incisions: Clean and dry. GU: Urine clear.  Lab Results: Recent Labs    02/24/24 1020  HGB 15.0  HCT 44.4    Assessment/Plan: POD#0   1) Continue to monitor, ambulate, IS   Bill Campbell. MD   LOS: 0 days   Crecencio Mc 02/24/2024, 2:52 PM

## 2024-02-24 NOTE — Anesthesia Postprocedure Evaluation (Signed)
 Anesthesia Post Note  Patient: Bill Campbell  Procedure(s) Performed: XI ROBOTIC ASSISTED LAPAROSCOPIC RADICAL PROSTATECTOMY LEVEL 2 (Abdomen) BILATERAL PELVIC LYMPH NODE DISSECTION (Bilateral: Abdomen)     Patient location during evaluation: PACU Anesthesia Type: General Level of consciousness: awake and alert Pain management: pain level controlled Vital Signs Assessment: post-procedure vital signs reviewed and stable Respiratory status: spontaneous breathing, nonlabored ventilation, respiratory function stable and patient connected to nasal cannula oxygen Cardiovascular status: blood pressure returned to baseline and stable Postop Assessment: no apparent nausea or vomiting Anesthetic complications: no  No notable events documented.  Last Vitals:  Vitals:   02/24/24 1515 02/24/24 1540  BP: (!) 164/97 132/85  Pulse: (!) 56 (!) 55  Resp: 14 19  Temp:  36.6 C  SpO2: 99% 98%    Last Pain:  Vitals:   02/24/24 1540  TempSrc: Oral  PainSc: 4                  Kennieth Rad

## 2024-02-24 NOTE — Interval H&P Note (Signed)
 History and Physical Interval Note:  02/24/2024 6:55 AM  Bill Campbell  has presented today for surgery, with the diagnosis of PROSTATE CANCER.  The various methods of treatment have been discussed with the patient and family. After consideration of risks, benefits and other options for treatment, the patient has consented to  Procedure(s) with comments: XI ROBOTIC ASSISTED LAPAROSCOPIC RADICAL PROSTATECTOMY LEVEL 2 (N/A) - 210 MINUTES NEEDED BILATERAL PELVIC LYMPH NODE DISSECTION (Bilateral) as a surgical intervention.  The patient's history has been reviewed, patient examined, no change in status, stable for surgery.  I have reviewed the patient's chart and labs.  Questions were answered to the patient's satisfaction.     [employer]

## 2024-02-24 NOTE — Plan of Care (Signed)
  Problem: Education: Goal: Knowledge of the procedure and recovery process will improve Outcome: Progressing   Problem: Bowel/Gastric: Goal: Gastrointestinal status for postoperative course will improve Outcome: Progressing   Problem: Pain Management: Goal: General experience of comfort will improve Outcome: Progressing   Problem: Skin Integrity: Goal: Demonstration of wound healing without infection will improve Outcome: Progressing

## 2024-02-24 NOTE — Anesthesia Preprocedure Evaluation (Signed)
 Anesthesia Evaluation  Patient identified by MRN, date of birth, ID band Patient awake    Reviewed: Allergy & Precautions, NPO status , Patient's Chart, lab work & pertinent test results  Airway Mallampati: III  TM Distance: >3 FB Neck ROM: Full    Dental   Pulmonary neg pulmonary ROS   breath sounds clear to auscultation       Cardiovascular negative cardio ROS  Rhythm:Regular Rate:Normal     Neuro/Psych negative neurological ROS     GI/Hepatic negative GI ROS, Neg liver ROS,,,  Endo/Other  negative endocrine ROS    Renal/GU negative Renal ROS   Prostate CA    Musculoskeletal  (+) Arthritis ,    Abdominal   Peds  Hematology negative hematology ROS (+)   Anesthesia Other Findings   Reproductive/Obstetrics                             Anesthesia Physical Anesthesia Plan  ASA: 2  Anesthesia Plan: General   Post-op Pain Management: Ofirmev IV (intra-op)* and Toradol IV (intra-op)*   Induction: Intravenous  PONV Risk Score and Plan: 2 and Dexamethasone, Ondansetron and Treatment may vary due to age or medical condition  Airway Management Planned: Oral ETT  Additional Equipment:   Intra-op Plan:   Post-operative Plan: Extubation in OR  Informed Consent: I have reviewed the patients History and Physical, chart, labs and discussed the procedure including the risks, benefits and alternatives for the proposed anesthesia with the patient or authorized representative who has indicated his/her understanding and acceptance.     Dental advisory given  Plan Discussed with: CRNA  Anesthesia Plan Comments:        Anesthesia Quick Evaluation

## 2024-02-24 NOTE — Discharge Instructions (Signed)

## 2024-02-24 NOTE — Transfer of Care (Signed)
 Immediate Anesthesia Transfer of Care Note  Patient: Bill Campbell  Procedure(s) Performed: XI ROBOTIC ASSISTED LAPAROSCOPIC RADICAL PROSTATECTOMY LEVEL 2 (Abdomen) BILATERAL PELVIC LYMPH NODE DISSECTION (Bilateral: Abdomen)  Patient Location: PACU  Anesthesia Type:General  Level of Consciousness: sedated and responds to stimulation  Airway & Oxygen Therapy: Patient Spontanous Breathing and Patient connected to nasal cannula oxygen  Post-op Assessment: Report given to RN and Post -op Vital signs reviewed and stable  Post vital signs: Reviewed and stable  Last Vitals:  Vitals Value Taken Time  BP 125/77 02/24/24 0951  Temp  98.4  Pulse 59 02/24/24 0952  Resp 18 02/24/24 0952  SpO2 99 % 02/24/24 0952  Vitals shown include unfiled device data.  Last Pain:  Vitals:   02/24/24 0551  TempSrc:   PainSc: 8       Patients Stated Pain Goal: 5 (02/24/24 0551)  Complications: No notable events documented.

## 2024-02-24 NOTE — Op Note (Signed)
 Preoperative diagnosis: Clinically localized adenocarcinoma of the prostate (clinical stage T1c Nx Mx)  Postoperative diagnosis: Clinically localized adenocarcinoma of the prostate (clinical stage T1c Nx Mx)  Procedure:  Robotic assisted laparoscopic radical prostatectomy (bilateral nerve sparing) Bilateral robotic assisted laparoscopic pelvic lymphadenectomy  Surgeon: Moody Bruins. M.D.  Assistant: Harrie Foreman, PA-C  An assistant was required for this surgical procedure.  The duties of the assistant included but were not limited to suctioning, passing suture, camera manipulation, retraction. This procedure would not be able to be performed without an Geophysicist/field seismologist.  Anesthesia: General  Complications: None  EBL: 75 mL  IVF:  1000 mL crystalloid  Specimens: Prostate and seminal vesicles Right pelvic lymph nodes Left pelvic lymph nodes  Disposition of specimens: Pathology  Drains: 20 Fr coude catheter # 19 Blake pelvic drain  Indication: Bill Campbell is a 58 y.o. year old patient with clinically localized prostate cancer.  After a thorough review of the management options for treatment of prostate cancer, he elected to proceed with surgical therapy and the above procedure(s).  We have discussed the potential benefits and risks of the procedure, side effects of the proposed treatment, the likelihood of the patient achieving the goals of the procedure, and any potential problems that might occur during the procedure or recuperation. Informed consent has been obtained.  Description of procedure:  The patient was taken to the operating room and a general anesthetic was administered. He was given preoperative antibiotics, placed in the dorsal lithotomy position, and prepped and draped in the usual sterile fashion. Next a preoperative timeout was performed. A urethral catheter was placed into the bladder and a site was selected near the umbilicus for placement of the camera  port. This was placed using a standard open Hassan technique which allowed entry into the peritoneal cavity under direct vision and without difficulty. An 8 mm robotic port was placed and a pneumoperitoneum established. The camera was then used to inspect the abdomen and there was no evidence of any intra-abdominal injuries or other abnormalities. The remaining abdominal ports were then placed. 8 mm robotic ports were placed in the right lower quadrant, left lower quadrant, and far left lateral abdominal wall. A 5 mm port was placed in the right upper quadrant and a 12 mm port was placed in the right lateral abdominal wall for laparoscopic assistance. All ports were placed under direct vision without difficulty. The surgical cart was then docked.   Utilizing the cautery scissors, the bladder was reflected posteriorly allowing entry into the space of Retzius and identification of the endopelvic fascia and prostate. The periprostatic fat was then removed from the prostate allowing full exposure of the endopelvic fascia. The endopelvic fascia was then incised from the apex back to the base of the prostate bilaterally and the underlying levator muscle fibers were swept laterally off the prostate thereby isolating the dorsal venous complex. The dorsal vein was then stapled and divided with a 45 mm Flex Echelon stapler. Attention then turned to the bladder neck which was divided anteriorly thereby allowing entry into the bladder and exposure of the urethral catheter. The catheter balloon was deflated and the catheter was brought into the operative field and used to retract the prostate anteriorly. The posterior bladder neck was then examined and was divided allowing further dissection between the bladder and prostate posteriorly until the vasa deferentia and seminal vessels were identified. The vasa deferentia were isolated, divided, and lifted anteriorly. The seminal vesicles were dissected down to  their tips with care  to control the seminal vascular arterial blood supply. These structures were then lifted anteriorly and the space between Denonvillier's fascia and the anterior rectum was developed with a combination of sharp and blunt dissection. This isolated the vascular pedicles of the prostate.  The lateral prostatic fascia was then sharply incised allowing release of the neurovascular bundles bilaterally. The vascular pedicles of the prostate were then ligated with Weck clips between the prostate and neurovascular bundles and divided with sharp cold scissor dissection resulting in neurovascular bundle preservation. The neurovascular bundles were then separated off the apex of the prostate and urethra bilaterally.  The urethra was then sharply transected allowing the prostate specimen to be disarticulated. The pelvis was copiously irrigated and hemostasis was ensured. There was no evidence for rectal injury.  Attention then turned to the right pelvic sidewall. The fibrofatty tissue between the external iliac vein, confluence of the iliac vessels, hypogastric artery, and Cooper's ligament was dissected free from the pelvic sidewall with care to preserve the obturator nerve. Weck clips were used for lymphostasis and hemostasis. An identical procedure was performed on the contralateral side and the lymphatic packets were removed for permanent pathologic analysis.  Attention then turned to the urethral anastomosis. A 2-0 Vicryl slip knot was placed between Denonvillier's fascia, the posterior bladder neck, and the posterior urethra to reapproximate these structures. A double-armed 3-0 Monocryl suture was then used to perform a 360 running tension-free anastomosis between the bladder neck and urethra. A new urethral catheter was then placed into the bladder and irrigated. There were no blood clots within the bladder and the anastomosis appeared to be watertight. A #19 Blake drain was then brought through the left lateral 8  mm port site and positioned appropriately within the pelvis. It was secured to the skin with a nylon suture. The surgical cart was then undocked. The right lateral 12 mm port site was closed at the fascial level with a 0 Vicryl suture placed laparoscopically. All remaining ports were then removed under direct vision. The prostate specimen was removed intact within the Endopouch retrieval bag via the periumbilical camera port site. This fascial opening was closed with two running 0 PDS sutures. 0.25% Marcaine was then injected into all port sites and all incisions were reapproximated at the skin level with 4-0 Monocryl subcuticular sutures and Dermabond. The patient appeared to tolerate the procedure well and without complications. The patient was able to be extubated and transferred to the recovery unit in satisfactory condition.   Moody Bruins MD

## 2024-02-25 ENCOUNTER — Encounter (HOSPITAL_COMMUNITY): Payer: Self-pay | Admitting: Urology

## 2024-02-25 DIAGNOSIS — C61 Malignant neoplasm of prostate: Secondary | ICD-10-CM | POA: Diagnosis not present

## 2024-02-25 LAB — HEMOGLOBIN AND HEMATOCRIT, BLOOD
HCT: 43.4 % (ref 39.0–52.0)
Hemoglobin: 14.1 g/dL (ref 13.0–17.0)

## 2024-02-25 MED ORDER — TRAMADOL HCL 50 MG PO TABS: 50.0000 mg | ORAL_TABLET | Freq: Four times a day (QID) | ORAL | Status: DC | PRN

## 2024-02-25 MED ORDER — BISACODYL 10 MG RE SUPP
10.0000 mg | Freq: Once | RECTAL | Status: AC
  Administered 2024-02-25: 10 mg via RECTAL
  Filled 2024-02-25: qty 1

## 2024-02-25 NOTE — Progress Notes (Signed)
 Patient to be discharged to home today. All discharge Instructions including all discharge Medications and schedules for these Medications. Home post Prostatectomy teaching reviewed with the Patient and the Patient's Wife. Understanding verbalized of all discharge teaching/instructions/ Discharge AVS with the Patient at discharge.

## 2024-02-25 NOTE — Discharge Summary (Signed)
.  Date of admission: 02/24/2024  Date of discharge: 02/25/2024  Admission diagnosis: Prostate Cancer  Discharge diagnosis: Prostate Cancer  History and Physical: For full details, please see admission history and physical. Briefly, Bill Campbell is a 58 y.o. gentleman with localized prostate cancer.  After discussing management/treatment options, he elected to proceed with surgical treatment.  Hospital Course: Bill Campbell was taken to the operating room on 02/24/2024 and underwent a robotic assisted laparoscopic radical prostatectomy. He tolerated this procedure well and without complications. Postoperatively, he was able to be transferred to a regular hospital room following recovery from anesthesia.  He was able to begin ambulating the night of surgery. He remained hemodynamically stable overnight.  He had excellent urine output with appropriately minimal output from his pelvic drain and his pelvic drain was removed on POD #1.  He was transitioned to oral pain medication, tolerated a clear liquid diet, and had met all discharge criteria and was able to be discharged home later on POD#1.  Laboratory values:  Recent Labs    02/24/24 1020 02/25/24 0404  HGB 15.0 14.1  HCT 44.4 43.4    Disposition: Home  Discharge instruction: He was instructed to be ambulatory but to refrain from heavy lifting, strenuous activity, or driving. He was instructed on urethral catheter care.  Discharge medications:   Allergies as of 02/25/2024       Reactions   Penicillins Other (See Comments)   Reaction as a child        Medication List     TAKE these medications    acetaminophen 500 MG tablet Commonly known as: TYLENOL Take 1,000 mg by mouth in the morning.   allopurinol 300 MG tablet Commonly known as: ZYLOPRIM TAKE 1 TABLET BY MOUTH EVERY DAY   docusate sodium 100 MG capsule Commonly known as: COLACE Take 1 capsule (100 mg total) by mouth 2 (two) times daily.   indomethacin 25  MG capsule Commonly known as: INDOCIN Take 1 capsule (25 mg total) by mouth 3 (three) times daily with meals. What changed:  when to take this reasons to take this   nystatin cream Commonly known as: MYCOSTATIN Apply 1 Application topically 2 (two) times daily. What changed:  when to take this additional instructions   sulfamethoxazole-trimethoprim 800-160 MG tablet Commonly known as: BACTRIM DS Take 1 tablet by mouth 2 (two) times daily. Start the day prior to foley removal appointment   traMADol 50 MG tablet Commonly known as: Ultram Take 1-2 tablets (50-100 mg total) by mouth every 6 (six) hours as needed for moderate pain (pain score 4-6) or severe pain (pain score 7-10).        Followup: He will followup in 1 week for catheter removal and to discuss his surgical pathology results.

## 2024-02-25 NOTE — Progress Notes (Signed)
 Patient ID: Bill Campbell, male   DOB: 02-03-1966, 58 y.o.   MRN: [id number]  1 Day Post-Op Subjective: The patient is doing well.  No nausea or vomiting. Pain is adequately controlled.  Objective: Vital signs in last 24 hours: Temp:  [97.8 F (36.6 C)-99.6 F (37.6 C)] 97.8 F (36.6 C) (04/11 0513) Pulse Rate:  [50-73] 50 (04/11 0513) Resp:  [8-19] 15 (04/11 0513) BP: (121-201)/(72-114) 122/72 (04/11 0513) SpO2:  [93 %-100 %] 98 % (04/11 0513)  Intake/Output from previous day: 04/10 0701 - 04/11 0700 In: 3443.2 [I.V.:3093.2; IV Piggyback:350] Out: 2645 [Urine:2400; Drains:170; Blood:75] Intake/Output this shift: No intake/output data recorded.  Physical Exam:  General: Alert and oriented. CV: RRR Lungs: Clear bilaterally. GI: Soft, Nondistended. Incisions: Clean, dry, and intact Urine: Clear Extremities: Nontender, no erythema, no edema.  Lab Results: Recent Labs    02/24/24 1020 02/25/24 0404  HGB 15.0 14.1  HCT 44.4 43.4      Assessment/Plan: POD# 1 s/p robotic prostatectomy.  1) SL IVF 2) Ambulate, Incentive spirometry 3) Transition to oral pain medication 4) Dulcolax suppository 5) D/C pelvic drain 6) Plan for likely discharge later today   Bill Campbell. MD   LOS: 0 days   Bill Campbell 02/25/2024, 7:10 AM

## 2024-02-28 LAB — SURGICAL PATHOLOGY

## 2024-03-07 DIAGNOSIS — C61 Malignant neoplasm of prostate: Secondary | ICD-10-CM | POA: Diagnosis not present

## 2024-04-12 DIAGNOSIS — M75121 Complete rotator cuff tear or rupture of right shoulder, not specified as traumatic: Secondary | ICD-10-CM | POA: Diagnosis not present

## 2024-05-30 DIAGNOSIS — C61 Malignant neoplasm of prostate: Secondary | ICD-10-CM | POA: Diagnosis not present

## 2024-06-06 DIAGNOSIS — N393 Stress incontinence (female) (male): Secondary | ICD-10-CM | POA: Diagnosis not present

## 2024-06-06 DIAGNOSIS — N5201 Erectile dysfunction due to arterial insufficiency: Secondary | ICD-10-CM | POA: Diagnosis not present

## 2024-06-06 DIAGNOSIS — C61 Malignant neoplasm of prostate: Secondary | ICD-10-CM | POA: Diagnosis not present

## 2024-06-12 ENCOUNTER — Other Ambulatory Visit: Payer: Self-pay

## 2024-06-12 DIAGNOSIS — Z8739 Personal history of other diseases of the musculoskeletal system and connective tissue: Secondary | ICD-10-CM

## 2024-06-12 DIAGNOSIS — M109 Gout, unspecified: Secondary | ICD-10-CM

## 2024-06-12 MED ORDER — INDOMETHACIN 25 MG PO CAPS: 25.0000 mg | ORAL_CAPSULE | Freq: Three times a day (TID) | ORAL | 2 refills | Status: AC

## 2024-09-01 DIAGNOSIS — C61 Malignant neoplasm of prostate: Secondary | ICD-10-CM | POA: Diagnosis not present

## 2024-09-08 DIAGNOSIS — N5201 Erectile dysfunction due to arterial insufficiency: Secondary | ICD-10-CM | POA: Diagnosis not present

## 2024-09-08 DIAGNOSIS — C61 Malignant neoplasm of prostate: Secondary | ICD-10-CM | POA: Diagnosis not present

## 2024-09-08 DIAGNOSIS — R399 Unspecified symptoms and signs involving the genitourinary system: Secondary | ICD-10-CM | POA: Diagnosis not present

## 2024-10-31 ENCOUNTER — Ambulatory Visit: Payer: Self-pay

## 2024-10-31 DIAGNOSIS — Z0000 Encounter for general adult medical examination without abnormal findings: Secondary | ICD-10-CM

## 2024-10-31 LAB — POCT URINALYSIS DIPSTICK
Bilirubin, UA: NEGATIVE
Blood, UA: POSITIVE
Glucose, UA: NEGATIVE
Ketones, UA: NEGATIVE
Leukocytes, UA: NEGATIVE
Nitrite, UA: NEGATIVE
Protein, UA: NEGATIVE
Spec Grav, UA: 1.025 (ref 1.010–1.025)
Urobilinogen, UA: 0.2 U/dL
pH, UA: 6 (ref 5.0–8.0)

## 2024-11-01 LAB — CMP12+LP+TP+TSH+6AC+PSA+CBC…
ALT: 22 IU/L (ref 0–44)
AST: 18 IU/L (ref 0–40)
Albumin: 4.4 g/dL (ref 3.8–4.9)
Alkaline Phosphatase: 59 IU/L (ref 47–123)
BUN/Creatinine Ratio: 12 (ref 9–20)
BUN: 13 mg/dL (ref 6–24)
Basophils Absolute: 0.1 x10E3/uL (ref 0.0–0.2)
Basos: 1 %
Bilirubin Total: 1.5 mg/dL — ABNORMAL HIGH (ref 0.0–1.2)
Calcium: 9.4 mg/dL (ref 8.7–10.2)
Chloride: 101 mmol/L (ref 96–106)
Chol/HDL Ratio: 4.9 ratio (ref 0.0–5.0)
Cholesterol, Total: 175 mg/dL (ref 100–199)
Creatinine, Ser: 1.05 mg/dL (ref 0.76–1.27)
EOS (ABSOLUTE): 0.1 x10E3/uL (ref 0.0–0.4)
Eos: 2 %
Estimated CHD Risk: 1 times avg. (ref 0.0–1.0)
Free Thyroxine Index: 2 (ref 1.2–4.9)
GGT: 21 IU/L (ref 0–65)
Globulin, Total: 2.2 g/dL (ref 1.5–4.5)
Glucose: 88 mg/dL (ref 70–99)
HDL: 36 mg/dL — ABNORMAL LOW (ref >39)
Hematocrit: 48.3 % (ref 37.5–51.0)
Hemoglobin: 16.2 g/dL (ref 13.0–17.7)
Immature Grans (Abs): 0 x10E3/uL (ref 0.0–0.1)
Immature Granulocytes: 0 %
Iron: 112 ug/dL (ref 38–169)
LDH: 167 IU/L (ref 121–224)
LDL Chol Calc (NIH): 110 mg/dL — ABNORMAL HIGH (ref 0–99)
Lymphocytes Absolute: 1.7 x10E3/uL (ref 0.7–3.1)
Lymphs: 29 %
MCH: 31.1 pg (ref 26.6–33.0)
MCHC: 33.5 g/dL (ref 31.5–35.7)
MCV: 93 fL (ref 79–97)
Monocytes Absolute: 0.4 x10E3/uL (ref 0.1–0.9)
Monocytes: 6 %
Neutrophils Absolute: 3.5 x10E3/uL (ref 1.4–7.0)
Neutrophils: 62 %
Phosphorus: 3.3 mg/dL (ref 2.8–4.1)
Platelets: 232 x10E3/uL (ref 150–450)
Potassium: 4.8 mmol/L (ref 3.5–5.2)
Prostate Specific Ag, Serum: <0.1 ng/mL (ref 0.0–4.0)
RBC: 5.21 x10E6/uL (ref 4.14–5.80)
RDW: 11.9 % (ref 11.6–15.4)
Sodium: 139 mmol/L (ref 134–144)
T3 Uptake Ratio: 28 % (ref 24–39)
T4, Total: 7.2 ug/dL (ref 4.5–12.0)
TSH: 1.99 u[IU]/mL (ref 0.450–4.500)
Total Protein: 6.6 g/dL (ref 6.0–8.5)
Triglycerides: 164 mg/dL — ABNORMAL HIGH (ref 0–149)
Uric Acid: 6 mg/dL (ref 3.8–8.4)
VLDL Cholesterol Cal: 29 mg/dL (ref 5–40)
WBC: 5.7 x10E3/uL (ref 3.4–10.8)
eGFR: 83 mL/min/1.73 (ref >59)

## 2024-11-07 ENCOUNTER — Ambulatory Visit: Payer: Self-pay | Admitting: Physician Assistant

## 2024-11-07 ENCOUNTER — Encounter: Payer: Self-pay | Admitting: Physician Assistant

## 2024-11-07 VITALS — BP 129/76 | HR 56 | Temp 97.6°F | Resp 16 | Ht 72.0 in | Wt 261.0 lb

## 2024-11-07 DIAGNOSIS — Z0000 Encounter for general adult medical examination without abnormal findings: Secondary | ICD-10-CM

## 2024-11-07 NOTE — Progress Notes (Signed)
 Pt presents today to complete physical, pt didn't voice any concerns at this time. Cl,RMA

## 2024-11-07 NOTE — Progress Notes (Signed)
 "  [facility]  ____________________________________________   None    (approximate)  I have reviewed the triage vital signs and the nursing notes.   HISTORY  Chief Complaint Annual Exam   HPI Bill Campbell is a 58 y.o. male patient presents for annual physical exam.  Patient close no concerns or complaints.         Past Medical History:  Diagnosis Date   Arthritis    Asthma    childhood no problems now   Back pain, chronic    Colon polyp 06/2014   Elevated bilirubin    Elevated lipids    Family history of colon cancer    Gout    Overweight    Plantar fasciitis     Patient Active Problem List   Diagnosis Date Noted   Prostate cancer (HCC) 02/24/2024   Olecranon bursitis of right elbow 07/04/2021   History of gout 05/29/2019   Chronic low back pain without sciatica 05/29/2019   Low serum HDL 05/29/2019   Gout 07/20/2014   Class 2 obesity due to excess calories without serious comorbidity with body mass index (BMI) of 35.0 to 35.9 in adult 07/20/2014    Past Surgical History:  Procedure Laterality Date   APPENDECTOMY     KNEE SURGERY     PELVIC LYMPH NODE DISSECTION Bilateral 02/24/2024   Procedure: BILATERAL PELVIC LYMPH NODE DISSECTION;  Surgeon: Renda Glance, MD;  Location: [facility];  Service: Urology;  Laterality: Bilateral;   ROBOT ASSISTED LAPAROSCOPIC RADICAL PROSTATECTOMY N/A 02/24/2024   Procedure: XI ROBOTIC ASSISTED LAPAROSCOPIC RADICAL PROSTATECTOMY LEVEL 2;  Surgeon: Renda Glance, MD;  Location: [facility];  Service: Urology;  Laterality: N/A;  210 MINUTES NEEDED   SHOULDER ARTHROSCOPY WITH SUBACROMIAL DECOMPRESSION AND OPEN ROTATOR C Right 09/24/2023   Procedure: Right shoulder arthroscopic rotator cuff repair (subscapularis and supraspinatus), subacromial decompression, distal clavicle excision, and biceps tenodesis;  Surgeon: Tobie Priest, MD;  Location: [facility];  Service: Orthopedics;  Laterality:  Right;   TONSILLECTOMY AND ADENOIDECTOMY     VASECTOMY      Prior to Admission medications  Medication Sig Start Date End Date Taking? Authorizing Provider  acetaminophen  (TYLENOL ) 500 MG tablet Take 1,000 mg by mouth in the morning.    [provider]  allopurinol  (ZYLOPRIM ) 300 MG tablet TAKE 1 TABLET BY MOUTH EVERY DAY 02/18/24   Claudene Tanda POUR, PA-C  docusate sodium  (COLACE) 100 MG capsule Take 1 capsule (100 mg total) by mouth 2 (two) times daily. 02/24/24   Cory Palma, PA-C  indomethacin  (INDOCIN ) 25 MG capsule Take 1 capsule (25 mg total) by mouth 3 (three) times daily with meals. 06/12/24   Claudene Tanda POUR, PA-C  nystatin  cream (MYCOSTATIN ) Apply 1 Application topically 2 (two) times daily. Patient taking differently: Apply 1 Application topically every 3 (three) days. Ear irritation. 09/15/23   Claudene Tanda POUR, PA-C  sulfamethoxazole -trimethoprim  (BACTRIM  DS) 800-160 MG tablet Take 1 tablet by mouth 2 (two) times daily. Start the day prior to foley removal appointment 02/24/24   Cory Palma, PA-C  traMADol  (ULTRAM ) 50 MG tablet Take 1-2 tablets (50-100 mg total) by mouth every 6 (six) hours as needed for moderate pain (pain score 4-6) or severe pain (pain score 7-10). 02/24/24   Cory Palma, PA-C    Allergies Penicillins  Family History  Problem Relation Age of Onset   Hypertension Mother    Osteoporosis Mother    Heart attack Father  Colon cancer Paternal Grandfather     Social History Social History[1]  Review of Systems Constitutional: No fever/chills Eyes: No visual changes. ENT: No sore throat. Cardiovascular: Denies chest pain. Respiratory: Denies shortness of breath. Gastrointestinal: No abdominal pain.  No nausea, no vomiting.  No diarrhea.  No constipation. Genitourinary: Negative for dysuria. Musculoskeletal: Negative for back pain. Skin: Negative for rash. Neurological: Negative for headaches, focal weakness or numbness.   Allergic/Immunilogical: Penicillin  ____________________________________________   PHYSICAL EXAM:  VITAL SIGNS: BP 129/76  Cuff Size Large  Pulse Rate 56  Temp 97.6 F (36.4 C)  Temp Source Temporal  Weight 261 lb (118.4 kg)  Height 6' (1.829 m)  Resp 16  SpO2 100 %   BMI: 35.40 kg/m2  BSA: 2.45 m2   Constitutional: Alert and oriented. Well appearing and in no acute distress. Eyes: Conjunctivae are normal. PERRL. EOMI. Head: Atraumatic. Nose: No congestion/rhinnorhea. Mouth/Throat: Mucous membranes are moist.  Oropharynx non-erythematous. Neck: No stridor.No cervical spine tenderness to palpation. Hematological/Lymphatic/Immunilogical: No cervical lymphadenopathy. Cardiovascular: Normal rate, regular rhythm. Grossly normal heart sounds.  Good peripheral circulation. Respiratory: Normal respiratory effort.  No retractions. Lungs CTAB. Gastrointestinal: Soft and nontender. No distention. No abdominal bruits. No CVA tenderness. Genitourinary: Deferred Musculoskeletal: No lower extremity tenderness nor edema.  No joint effusions. Neurologic:  Normal speech and language. No gross focal neurologic deficits are appreciated. No gait instability. Skin:  Skin is warm, dry and intact. No rash noted. Psychiatric: Mood and affect are normal. Speech and behavior are normal.  ____________________________________________   LABS          Component Ref Range & Units (hover) 7 d ago 1 yr ago 2 yr ago 3 yr ago 4 yr ago 5 yr ago  Color, UA Amber yellow Dark Yellow amber yellow Amber  Clarity, UA Clear clear Clear clear cloudy Clear  Glucose, UA Negative Negative Negative Negative Negative Negative  Bilirubin, UA Negative neg Negative negative negative Negative  Ketones, UA Negative neg Negative negative negative 1+ CM  Spec Grav, UA 1.025 1.010 1.025 1.015 1.025 1.015  Blood, UA Positive neg Negative negative negative Negative  Comment: 1+  pH, UA 6.0 6.5 6.0 7.5 6.0 8.0  Protein,  UA Negative Negative Negative Positive Abnormal  CM Positive Abnormal  CM Negative  Urobilinogen, UA 0.2 0.2 0.2 0.2 0.2 0.2  Nitrite, UA Negative neg Negative negative negative Negative  Leukocytes, UA Negative Negative Negative Negative Negative Negative  Appearance  dark  dark dark   Odor                     Component Ref Range & Units (hover) 7 d ago (10/31/24) 8 mo ago (02/25/24) 8 mo ago (02/24/24) 8 mo ago (02/16/24) 8 mo ago (02/16/24) 1 yr ago (10/06/23) 1 yr ago (09/13/23)  Glucose 88   110 High  CM   95  Uric Acid 6.0      6.0 CM  Comment:            Therapeutic target for gout patients: <6.0  BUN 13   16 R   14  Creatinine, Ser 1.05   0.92 R   1.02  eGFR 83      86  BUN/Creatinine Ratio 12      14  Sodium 139   137 R   141  Potassium 4.8   4.7 R   4.9  Chloride 101   105 R   102  Calcium 9.4  8.7 Low  R   9.2  Phosphorus 3.3      2.9  Total Protein 6.6      7.1  Albumin  4.4      4.7  Globulin, Total 2.2      2.4  Bilirubin Total 1.5 High       1.1  Alkaline Phosphatase 59      65 R  LDH 167      171  AST 18      19  ALT 22      24  GGT 21      34  Iron 112      102  Cholesterol, Total 175      182  Triglycerides 164 High       185 High   HDL 36 Low       37 Low   VLDL Cholesterol Cal 29      33  LDL Chol Calc (NIH) 110 High       112 High   Chol/HDL Ratio 4.9      4.9 CM  Comment:                                   T. Chol/HDL Ratio                                             Men  Women                               1/2 Avg.Risk  3.4    3.3                                   Avg.Risk  5.0    4.4                                2X Avg.Risk  9.6    7.1                                3X Avg.Risk 23.4   11.0  Estimated CHD Risk 1.0      1.0 CM  Comment: The CHD Risk is based on the T. Chol/HDL ratio. Other factors affect CHD Risk such as hypertension, smoking, diabetes, severe obesity, and family history of premature CHD.  TSH 1.990      1.430  T4, Total 7.2       7.4  T3 Uptake Ratio 28      29  Free Thyroxine Index 2.0      2.1  Prostate Specific Ag, Serum <0.1     4.5 High  CM 5.5 High  CM  Comment: Roche ECLIA methodology. According to the American Urological Association, Serum PSA should decrease and remain at undetectable levels after radical prostatectomy. The AUA defines biochemical recurrence as an initial PSA value 0.2 ng/mL or greater followed by a subsequent confirmatory PSA value 0.2 ng/mL or greater. Values obtained with different assay methods or kits cannot be used interchangeably. Results cannot be interpreted as absolute evidence of the presence  or absence of malignant disease.  WBC 5.7    6.1 R  5.8  RBC 5.21    5.39 R  5.50  Hemoglobin 16.2 14.1 R 15.0 R  16.2 R  17.0  Hematocrit 48.3 43.4 R, CM 44.4 R, CM  49.7 R  50.0  MCV 93    92.2 R  91  MCH 31.1    30.1 R  30.9  MCHC 33.5    32.6 R  34.0  RDW 11.9    12.1 R  11.9  Platelets 232    245 R  273  Neutrophils 62      57  Lymphs 29      33  Monocytes 6      6  Eos 2      2  Basos 1      1  Neutrophils Absolute 3.5      3.3  Lymphocytes Absolute 1.7      1.9  Monocytes Absolute 0.4      0.3  EOS (ABSOLUTE) 0.1      0.1  Basophils Absolute 0.1      0.1  Immature Granulocytes 0      1  Immature Grans (Abs) 0.0      0.1                    ____________________________________________  EKG  Sinus bradycardia at 54 bpm ____________________________________________    ____________________________________________   INITIAL IMPRESSION / ASSESSMENT AND PLAN  As part of my medical decision making, I reviewed the following data within the electronic MEDICAL RECORD NUMBER      No acute findings on physical exam or EKG.  Labs reveal elevated triglycerides.  Patient elected to have a trial of dietary and exercise before considering statins.        ____________________________________________   FINAL CLINICAL IMPRESSION  Well exam   ED Discharge Orders      None        Note:  This document was prepared using Dragon voice recognition software and may include unintentional dictation errors. Appreciate elements 45 macular evaluation K Patient likely has this below. - Patient will    [1]  Social History Tobacco Use   Smoking status: Never    Passive exposure: Never   Smokeless tobacco: Current    Types: Chew  Vaping Use   Vaping status: Never Used  Substance Use Topics   Alcohol use: Yes    Alcohol/week: 6.0 standard drinks of alcohol    Types: 6 Cans of beer per week    Comment: minimal   Drug use: Never   "
